# Patient Record
Sex: Male | Born: 1935 | Race: White | Hispanic: No | Smoking: Former smoker
Health system: Southern US, Community
[De-identification: ages and names within clinical notes are randomized; demographics above are authoritative.]

## PROBLEM LIST (undated history)

## (undated) DIAGNOSIS — M1990 Unspecified osteoarthritis, unspecified site: Secondary | ICD-10-CM

## (undated) DIAGNOSIS — M1711 Unilateral primary osteoarthritis, right knee: Secondary | ICD-10-CM

## (undated) DIAGNOSIS — I4891 Unspecified atrial fibrillation: Secondary | ICD-10-CM

## (undated) DIAGNOSIS — H3530 Unspecified macular degeneration: Secondary | ICD-10-CM

## (undated) DIAGNOSIS — I10 Essential (primary) hypertension: Secondary | ICD-10-CM

## (undated) HISTORY — DX: Unspecified macular degeneration: H35.30

## (undated) HISTORY — PX: JOINT REPLACEMENT: SHX530

## (undated) HISTORY — DX: Unspecified atrial fibrillation: I48.91

## (undated) HISTORY — DX: Essential (primary) hypertension: I10

## (undated) HISTORY — PX: HIP SURGERY: SHX245

---

## 1957-12-13 HISTORY — PX: NOSE SURGERY: SHX723

## 1983-05-14 HISTORY — PX: FOOT ARTHRODESIS, MODIFIED MCBRIDE: SUR52

## 1997-05-13 HISTORY — PX: INGUINAL HERNIA REPAIR: SUR1180

## 1998-04-12 HISTORY — PX: KNEE CARTILAGE SURGERY: SHX688

## 2002-07-13 HISTORY — PX: ROTATOR CUFF REPAIR: SHX139

## 2012-07-13 HISTORY — PX: INGUINAL HERNIA REPAIR: SUR1180

## 2015-11-12 ENCOUNTER — Ambulatory Visit (INDEPENDENT_AMBULATORY_CARE_PROVIDER_SITE_OTHER): Payer: Medicare Other | Admitting: Cardiology

## 2015-11-12 ENCOUNTER — Encounter: Payer: Self-pay | Admitting: Cardiology

## 2015-11-12 VITALS — BP 140/84 | HR 61 | Ht 71.0 in | Wt 209.0 lb

## 2015-11-12 DIAGNOSIS — H3530 Unspecified macular degeneration: Secondary | ICD-10-CM

## 2015-11-12 DIAGNOSIS — I480 Paroxysmal atrial fibrillation: Secondary | ICD-10-CM

## 2015-11-12 DIAGNOSIS — I10 Essential (primary) hypertension: Secondary | ICD-10-CM | POA: Insufficient documentation

## 2015-11-12 DIAGNOSIS — I4819 Other persistent atrial fibrillation: Secondary | ICD-10-CM | POA: Insufficient documentation

## 2015-11-12 DIAGNOSIS — I481 Persistent atrial fibrillation: Secondary | ICD-10-CM | POA: Diagnosis not present

## 2015-11-12 LAB — BASIC METABOLIC PANEL
BUN: 22 mg/dL (ref 7–25)
CALCIUM: 9.5 mg/dL (ref 8.6–10.3)
CO2: 25 mmol/L (ref 20–31)
CREATININE: 1.33 mg/dL — AB (ref 0.70–1.11)
Chloride: 103 mmol/L (ref 98–110)
GLUCOSE: 129 mg/dL — AB (ref 65–99)
Potassium: 4.1 mmol/L (ref 3.5–5.3)
Sodium: 139 mmol/L (ref 135–146)

## 2015-11-12 NOTE — Progress Notes (Signed)
Electrophysiology Office Note   Date:  11/12/2015   ID:  Craig EchevariaMichael O Mann, DOB 1933-10-31, MRN [id number]  PCP:  Pcp Not In System  Primary Electrophysiologist:  Craig Mann Craig LoaMartin Alann Avey, MD    Chief Complaint  Patient presents with  . Advice Only    consult  . Atrial Fibrillation     History of Present Illness: Craig Mann is a 81 y.o. male who presents today for electrophysiology evaluation.    He presents to clinic today for evaluation of atrial fibrillation. He says that he was diagnosed at the beginning of October in [place]. He lives in [place] but was vacationing there at the time. He says that he was feeling weak and tired and did not have his normal energy and went to the hospital. While in the hospital he had an EKG which showed atrial fibrillation. He had an echocardiogram as well which showed a mildly dilated left atrium and normal LV systolic function. He is here visiting his brother for 2 months planning to leave in the middle of January. The doctor [place] told him to see a cardiologist. He currently does not have symptoms of shortness of breath but does have very mild fatigue. He is tolerating his medications well. He was started on apixaban while in the hospital in [place] and has not missed any doses.   Today, he denies symptoms of palpitations, chest pain, shortness of breath, orthopnea, PND, lower extremity edema, claudication, dizziness, presyncope, syncope, bleeding, or neurologic sequela. The patient is tolerating medications without difficulties and is otherwise without complaint today.    Past Medical History  Diagnosis Date  . Macular degeneration of left eye   . A-fib (HCC)   . Hypertension    Past Surgical History  Procedure Laterality Date  . Hernia repair Left 07/2012    DOUBLE HERNIA  . Rotator cuff repair Right 07/2002    SHOULDER  . Total knee arthroplasty Left 10/1999 and 1969    TIMES [id number],1962  . Hip surgery Left 04/1999 AND 10/1994     REPLACEMENT X2  . Knee surgery Left 04/1998    CARTILAGE  . Hernia repair Right 05/1997  . Foot arthrodesis, modified mcbride Right 05/1983  . Nose surgery  1959     Current Outpatient Prescriptions  Medication Sig Dispense Refill  . apixaban (ELIQUIS) 2.5 MG TABS tablet Take 2.5 mg by mouth 2 (two) times daily.    . Ascorbic Acid (VITAMIN C PO) Take by mouth.    Marland Kitchen. b complex vitamins tablet Take 1 tablet by mouth daily.    Marland Kitchen. BETA CAROTENE PO Take by mouth.    . Cholecalciferol (VITAMIN D-3 PO) Take by mouth.    . colchicine 0.6 MG tablet Take 0.6 mg by mouth daily as needed.    . DiltiaZEM HCl Coated Beads (CARTIA XT PO) Take 180 mg by mouth daily.     Marland Kitchen. GARLIC-X PO Take by mouth.    Marland Kitchen. lisinopril (PRINIVIL,ZESTRIL) 20 MG tablet Take 20 mg by mouth daily.    Marland Kitchen. MAGNESIUM OXIDE PO Take by mouth.    . metoprolol (LOPRESSOR) 50 MG tablet Take 50 mg by mouth 2 (two) times daily.    . Misc Natural Products (GLUCOSAMINE CHONDROITIN MSM PO) Take by mouth.    . Multiple Vitamins-Minerals (ICAPS AREDS 2 PO) Take two tablets by mouth daily    . Multiple Vitamins-Minerals (OCUVITE PO) Take two tablets by mouth  daily    . Multiple  Vitamins-Minerals (ZINC PO) Take by mouth.    . Omega-3 Fatty Acids (OMEGA 3 PO) 360 mg. Take two tablets daily    . Saw Palmetto, Serenoa repens, (SAW PALMETTO PO) Take by mouth.     No current facility-administered medications for this visit.    Allergies:   Review of patient's allergies indicates not on file.   Social History:  The patient  reports that he has quit smoking. He does not have any smokeless tobacco history on file. He reports that he drinks alcohol. He reports that he does not use illicit drugs.   Family History:  The patient's family history includes Kidney failure in his brother.    ROS:  Please see the history of present illness.   Otherwise, review of systems is positive for ankle swelling, constipation, anxiety.   All other systems are  reviewed and negative.    PHYSICAL EXAM: VS:  BP 140/84 mmHg  Pulse 61  Ht  (1.803 m)  Wt 209 lb (94.802 kg)  BMI 29.16 kg/m2 , BMI Body mass index is 29.16 kg/(m^2). GEN: Well nourished, well developed, in no acute distress HEENT: normal Neck: no JVD, carotid bruits, or masses Cardiac: irregular rhythm; no murmurs, rubs, or gallops,no edema  Respiratory:  clear to auscultation bilaterally, normal work of breathing GI: soft, nontender, nondistended, + BS MS: no deformity or atrophy Skin: warm and dry Neuro:  Strength and sensation are intact Psych: euthymic mood, full affect  EKG:  EKG is ordered today. The ekg ordered today shows atrial fibrillation, rate 61  Recent Labs: No results found for requested labs within last 365 days.    Lipid Panel  No results found for: CHOL, TRIG, HDL, CHOLHDL, VLDL, LDLCALC, LDLDIRECT   Wt Readings from Last 3 Encounters:  11/12/15 209 lb (94.802 kg)      Other studies Reviewed: Additional studies/ records that were reviewed today include: TTE 09/28/15 Review of the above records today demonstrates:   the left ventricular size, wall thickness, and systolic function are normal. EF 55 to 60%. The left atrium is mildly dilated. There is mild mitral regurgitation.   ASSESSMENT AND PLAN:  1.  Persistent Atrial fibrillation: newly diagnosed as of October. It does not appear that he has been in atrial fibrillation for a long as his left atrium is not very dilated. I have discussed with him treatment options such as medications versus cardioversion. He does not like the idea of medications as a first-line therapy and would prefer to try cardioversion for sinus rhythm. He is on Eliquis 2.5 mg and has had a low  GFR. This was checked in [place] and I do not have evidence of it being rechecked. I Effie Janoski recheck today and adjust his Eliquis as indicated.  If his Eliquis needs to be adjusted we Dominick Morella wait 3 weeks until cardioversion.  This  patients CHA2DS2-VASc Score and unadjusted Ischemic Stroke Rate (% per year) is equal to 3.2 % stroke rate/year from a score of 3  Above score calculated as 1 point each if present [CHF, HTN, DM, Vascular=MI/PAD/Aortic Plaque, Age if 65-74, or Male] Above score calculated as 2 points each if present [Age > 75, or Stroke/TIA/TE]     Current medicines are reviewed at length with the patient today.   The patient does not have concerns regarding his medicines.  The following changes were made today:  none  Labs/ tests ordered today include:  No orders of the defined types were placed in  this encounter.     Disposition:   FU with Vaughn Frieze post cardioversion Signed, Zowie Lundahl Craig Loa, MD  11/12/2015 10:15 AM     [facility] [address] [place] [address] [phone] (office) [phone] (fax)

## 2015-11-12 NOTE — Patient Instructions (Addendum)
Medication Instructions:  Your physician recommends that you continue on your current medications as directed. Please refer to the Current Medication list given to you today.  Labwork: Today - BMET  Testing/Procedures: None ordered  Follow-Up: No follow up is needed at this time with Dr. Elberta Fortisamnitz.  He will see you on an as needed basis.   If you need a refill on your cardiac medications before your next appointment, please call your pharmacy.  Thank you for choosing [facility]!!   Dory HornSherri Zhana Jeangilles, RN [phone]

## 2015-11-17 ENCOUNTER — Telehealth: Payer: Self-pay | Admitting: *Deleted

## 2015-11-17 ENCOUNTER — Encounter (INDEPENDENT_AMBULATORY_CARE_PROVIDER_SITE_OTHER): Payer: Medicare Other | Admitting: Ophthalmology

## 2015-11-17 DIAGNOSIS — H353112 Nonexudative age-related macular degeneration, right eye, intermediate dry stage: Secondary | ICD-10-CM | POA: Diagnosis not present

## 2015-11-17 DIAGNOSIS — H35033 Hypertensive retinopathy, bilateral: Secondary | ICD-10-CM

## 2015-11-17 DIAGNOSIS — H2513 Age-related nuclear cataract, bilateral: Secondary | ICD-10-CM | POA: Diagnosis not present

## 2015-11-17 DIAGNOSIS — H43813 Vitreous degeneration, bilateral: Secondary | ICD-10-CM

## 2015-11-17 DIAGNOSIS — H353221 Exudative age-related macular degeneration, left eye, with active choroidal neovascularization: Secondary | ICD-10-CM

## 2015-11-17 DIAGNOSIS — I10 Essential (primary) hypertension: Secondary | ICD-10-CM

## 2015-11-17 MED ORDER — APIXABAN 5 MG PO TABS
5.0000 mg | ORAL_TABLET | Freq: Two times a day (BID) | ORAL | Status: DC
Start: 2015-11-17 — End: 2018-05-10

## 2015-11-17 NOTE — Telephone Encounter (Signed)
Last Creatinine 1.33.   Advised patient to increase Eliquis to 5 mg twice per day.   Samples left at front desk for him to pick up tomorrow.  He understands I will arrange a DCCV 3 weeks from now and call him by the end of the week to discuss instructions.  Creatinine 11/30 was 1.33.  It was previously 1.6 on 10/26 and his PCP advised him to drink more water. He is planning on leaving for [place] (for 4 months) mid January, before returning to [place].  I advised him, while down in [place], to follow up on BMP to follow kidney function and ensure he is on the correct dose of Eliquis. I will review this again when I call him to discuss DCCV instructions. He is agreeable to above stated plan.

## 2015-11-20 ENCOUNTER — Telehealth: Payer: Self-pay | Admitting: *Deleted

## 2015-11-20 NOTE — Telephone Encounter (Signed)
Called patient to review DCCV instructions, per our agreement on 12/5. Patient tells me that he would like to wait for DCCV until he goes to [place] next month.  He would like to see his primary doctor there first. Advised patient to call office back if he changes his mind or symptoms occur.  Also advised to continue Eliquis. Patient verbalized understanding and agreeable to plan.

## 2015-11-29 ENCOUNTER — Emergency Department (HOSPITAL_COMMUNITY)
Admission: EM | Admit: 2015-11-29 | Discharge: 2015-11-29 | Disposition: A | Discharge status: Home / Self Care | Payer: Medicare Other | Attending: Emergency Medicine | Admitting: Emergency Medicine

## 2015-11-29 ENCOUNTER — Emergency Department (HOSPITAL_COMMUNITY): Payer: Medicare Other

## 2015-11-29 ENCOUNTER — Encounter (HOSPITAL_COMMUNITY): Payer: Self-pay | Admitting: Emergency Medicine

## 2015-11-29 DIAGNOSIS — Z7902 Long term (current) use of antithrombotics/antiplatelets: Secondary | ICD-10-CM | POA: Insufficient documentation

## 2015-11-29 DIAGNOSIS — I4891 Unspecified atrial fibrillation: Secondary | ICD-10-CM | POA: Diagnosis not present

## 2015-11-29 DIAGNOSIS — Z87891 Personal history of nicotine dependence: Secondary | ICD-10-CM | POA: Insufficient documentation

## 2015-11-29 DIAGNOSIS — Z79899 Other long term (current) drug therapy: Secondary | ICD-10-CM | POA: Insufficient documentation

## 2015-11-29 DIAGNOSIS — M7989 Other specified soft tissue disorders: Secondary | ICD-10-CM

## 2015-11-29 DIAGNOSIS — I10 Essential (primary) hypertension: Secondary | ICD-10-CM | POA: Insufficient documentation

## 2015-11-29 DIAGNOSIS — R220 Localized swelling, mass and lump, head: Secondary | ICD-10-CM | POA: Insufficient documentation

## 2015-11-29 DIAGNOSIS — Z8669 Personal history of other diseases of the nervous system and sense organs: Secondary | ICD-10-CM | POA: Insufficient documentation

## 2015-11-29 LAB — COMPREHENSIVE METABOLIC PANEL
ALBUMIN: 3.6 g/dL (ref 3.5–5.0)
ALK PHOS: 64 U/L (ref 38–126)
ALT: 54 U/L (ref 17–63)
AST: 27 U/L (ref 15–41)
Anion gap: 7 (ref 5–15)
BILIRUBIN TOTAL: 1 mg/dL (ref 0.3–1.2)
BUN: 23 mg/dL — AB (ref 6–20)
CALCIUM: 9.7 mg/dL (ref 8.9–10.3)
CO2: 27 mmol/L (ref 22–32)
CREATININE: 1.44 mg/dL — AB (ref 0.61–1.24)
Chloride: 106 mmol/L (ref 101–111)
GFR calc Af Amer: 51 mL/min — ABNORMAL LOW (ref >60)
GFR, EST NON AFRICAN AMERICAN: 44 mL/min — AB (ref >60)
GLUCOSE: 144 mg/dL — AB (ref 65–99)
Potassium: 4.1 mmol/L (ref 3.5–5.1)
Sodium: 140 mmol/L (ref 135–145)
TOTAL PROTEIN: 6.4 g/dL — AB (ref 6.5–8.1)

## 2015-11-29 LAB — CBC
HEMATOCRIT: 47.1 % (ref 39.0–52.0)
Hemoglobin: 15.3 g/dL (ref 13.0–17.0)
MCH: 30.3 pg (ref 26.0–34.0)
MCHC: 32.5 g/dL (ref 30.0–36.0)
MCV: 93.3 fL (ref 78.0–100.0)
PLATELETS: 144 10*3/uL — AB (ref 150–400)
RBC: 5.05 MIL/uL (ref 4.22–5.81)
RDW: 13.9 % (ref 11.5–15.5)
WBC: 6.7 10*3/uL (ref 4.0–10.5)

## 2015-11-29 LAB — URINALYSIS, ROUTINE W REFLEX MICROSCOPIC
BILIRUBIN URINE: NEGATIVE
Glucose, UA: NEGATIVE mg/dL
KETONES UR: NEGATIVE mg/dL
Leukocytes, UA: NEGATIVE
NITRITE: NEGATIVE
PH: 6 (ref 5.0–8.0)
PROTEIN: 30 mg/dL — AB
Specific Gravity, Urine: 1.012 (ref 1.005–1.030)

## 2015-11-29 LAB — URINE MICROSCOPIC-ADD ON: Bacteria, UA: NONE SEEN

## 2015-11-29 NOTE — Discharge Instructions (Signed)
You were seen today for your leg swelling (edema) the exact cause is not entirely known but it does not appear to be from your heart or from a blood clot.  If you develop chest pain or shortness of breath present for emergent reevaluation.  Try to exercise daily (light exercise like walking for 20 minutes), keeping your legs elevated when sitting or laying, and using compression stockings to see if this will help.  Try not to eat too much salt.  If the legs become red or hot or one starts swelling significantly more than the other return immediately for evaluation.  Edema Edema is an abnormal buildup of fluids in your bodytissues. Edema is somewhatdependent on gravity to pull the fluid to the lowest place in your body. That makes the condition more common in the legs and thighs (lower extremities). Painless swelling of the feet and ankles is common and becomes more likely as you get older. It is also common in looser tissues, like around your eyes.  When the affected area is squeezed, the fluid may move out of that spot and leave a dent for a few moments. This dent is called pitting.  CAUSES  There are many possible causes of edema. Eating too much salt and being on your feet or sitting for a long time can cause edema in your legs and ankles. Hot weather may make edema worse. Common medical causes of edema include:  Heart failure.  Liver disease.  Kidney disease.  Weak blood vessels in your legs.  Cancer.  An injury.  Pregnancy.  Some medications.  Obesity. SYMPTOMS  Edema is usually painless.Your skin may look swollen or shiny.  DIAGNOSIS  Your health care provider may be able to diagnose edema by asking about your medical history and doing a physical exam. You may need to have tests such as X-rays, an electrocardiogram, or blood tests to check for medical conditions that may cause edema.  TREATMENT  Edema treatment depends on the cause. If you have heart, liver, or kidney disease,  you need the treatment appropriate for these conditions. General treatment may include:  Elevation of the affected body part above the level of your heart.  Compression of the affected body part. Pressure from elastic bandages or support stockings squeezes the tissues and forces fluid back into the blood vessels. This keeps fluid from entering the tissues.  Restriction of salt intake.  Use of a water pill (diuretic). These medications are appropriate only for some types of edema. They pull fluid out of your body and make you urinate more often. This gets rid of fluid and reduces swelling, but diuretics can have side effects. Only use diuretics as directed by your health care provider. - This is something you should discuss with your primary care physician as your kidney function would need to be monitored closely. HOME CARE INSTRUCTIONS   Keep the affected body part above the level of your heart when you are lying down.   Do not sit still or stand for prolonged periods.   Do not put anything directly under your knees when lying down.  Do not wear constricting clothing or garters on your upper legs.   Exercise your legs to work the fluid back into your blood vessels. This may help the swelling go down.   Wear elastic bandages or support stockings to reduce ankle swelling as directed by your health care provider.   Eat a low-salt diet to reduce fluid if your health care provider  recommends it.   Only take medicines as directed by your health care provider. SEEK MEDICAL CARE IF:   Your edema is not responding to treatment.  You have heart, liver, or kidney disease and notice symptoms of edema.  You have edema in your legs that does not improve after elevating them.   You have sudden and unexplained weight gain. SEEK IMMEDIATE MEDICAL CARE IF:   You develop shortness of breath or chest pain.   You cannot breathe when you lie down.  You develop pain, redness, or warmth in  the swollen areas.   You have heart, liver, or kidney disease and suddenly get edema.  You have a fever and your symptoms suddenly get worse. MAKE SURE YOU:   Understand these instructions.  Will watch your condition.  Will get help right away if you are not doing well or get worse.   This information is not intended to replace advice given to you by your health care provider. Make sure you discuss any questions you have with your health care provider.   Document Released: 11/29/2005 Document Revised: 12/20/2014 Document Reviewed: 09/21/2013 Elsevier Interactive Patient Education 2016 [employer].  Diabetes Mellitus and Food It is important for you to manage your blood sugar (glucose) level. Your blood glucose level can be greatly affected by what you eat. Eating healthier foods in the appropriate amounts throughout the day at about the same time each day will help you control your blood glucose level. It can also help slow or prevent worsening of your diabetes mellitus. Healthy eating may even help you improve the level of your blood pressure and reach or maintain a healthy weight.  General recommendations for healthful eating and cooking habits include:  Eating meals and snacks regularly. Avoid going long periods of time without eating to lose weight.  Eating a diet that consists mainly of plant-based foods, such as fruits, vegetables, nuts, legumes, and whole grains.  Using low-heat cooking methods, such as baking, instead of high-heat cooking methods, such as deep frying. Work with your dietitian to make sure you understand how to use the Nutrition Facts information on food labels. HOW CAN FOOD AFFECT ME? Carbohydrates Carbohydrates affect your blood glucose level more than any other type of food. Your dietitian will help you determine how many carbohydrates to eat at each meal and teach you how to count carbohydrates. Counting carbohydrates is important to keep your blood  glucose at a healthy level, especially if you are using insulin or taking certain medicines for diabetes mellitus. Alcohol Alcohol can cause sudden decreases in blood glucose (hypoglycemia), especially if you use insulin or take certain medicines for diabetes mellitus. Hypoglycemia can be a life-threatening condition. Symptoms of hypoglycemia (sleepiness, dizziness, and disorientation) are similar to symptoms of having too much alcohol.  If your health care provider has given you approval to drink alcohol, do so in moderation and use the following guidelines:  Women should not have more than one drink per day, and men should not have more than two drinks per day. One drink is equal to:  12 oz of beer.  5 oz of wine.  1 oz of hard liquor.  Do not drink on an empty stomach.  Keep yourself hydrated. Have water, diet soda, or unsweetened iced tea.  Regular soda, juice, and other mixers might contain a lot of carbohydrates and should be counted. WHAT FOODS ARE NOT RECOMMENDED? As you make food choices, it is important to remember that all foods are  not the same. Some foods have fewer nutrients per serving than other foods, even though they might have the same number of calories or carbohydrates. It is difficult to get your body what it needs when you eat foods with fewer nutrients. Examples of foods that you should avoid that are high in calories and carbohydrates but low in nutrients include:  Trans fats (most processed foods list trans fats on the Nutrition Facts label).  Regular soda.  Juice.  Candy.  Sweets, such as cake, pie, doughnuts, and cookies.  Fried foods. WHAT FOODS CAN I EAT? Eat nutrient-rich foods, which will nourish your body and keep you healthy. The food you should eat also will depend on several factors, including:  The calories you need.  The medicines you take.  Your weight.  Your blood glucose level.  Your blood pressure level.  Your cholesterol  level. You should eat a variety of foods, including:  Protein.  Lean cuts of meat.  Proteins low in saturated fats, such as fish, egg whites, and beans. Avoid processed meats.  Fruits and vegetables.  Fruits and vegetables that may help control blood glucose levels, such as apples, mangoes, and yams.  Dairy products.  Choose fat-free or low-fat dairy products, such as milk, yogurt, and cheese.  Grains, bread, pasta, and rice.  Choose whole grain products, such as multigrain bread, whole oats, and brown rice. These foods may help control blood pressure.  Fats.  Foods containing healthful fats, such as nuts, avocado, olive oil, canola oil, and fish. DOES EVERYONE WITH DIABETES MELLITUS HAVE THE SAME MEAL PLAN? Because every person with diabetes mellitus is different, there is not one meal plan that works for everyone. It is very important that you meet with a dietitian who will help you create a meal plan that is just right for you.   This information is not intended to replace advice given to you by your health care provider. Make sure you discuss any questions you have with your health care provider.   Document Released: 08/26/2005 Document Revised: 12/20/2014 Document Reviewed: 10/26/2013 Elsevier Interactive Patient Education [employer]

## 2015-11-29 NOTE — ED Notes (Signed)
Pt sts bilateral leg swelling x several weeks getting more severe; pt denies SOB or other complaint

## 2015-11-29 NOTE — ED Notes (Signed)
Pt comfortable with discharge and follow up instructions. Pt declines wheelchair, escorted to waiting area by this RN. No prescriptions. 

## 2015-11-29 NOTE — ED Provider Notes (Signed)
CSN: [id number]     Arrival date & time 11/29/15  1241 History   First MD Initiated Contact with Patient 11/29/15 1506     Chief Complaint  Patient presents with  . Leg Swelling     (Consider location/radiation/quality/duration/timing/severity/associated sxs/prior Treatment) HPI Comments: 81 y.o. Male with history of atrial fibrillation on Eliquis, HTN, CKD presents for leg swelling.  The patient reports that he was diagnosed with atrial fibrillation in October and has not exercised or been active since then because he was scared this would make the atrial fibrillation worse.  He says that over the last 4 weeks or more he has had progressive swelling of his bilateral legs that he says does get worse when he is sitting for long periods of time.  Denies redness or warmth of the legs.  Takes his Eliquis daily.  Denies chest pain, shortness of breath, palpitations, orthopnea.   Past Medical History  Diagnosis Date  . Macular degeneration of left eye   . A-fib (HCC)   . Hypertension    Past Surgical History  Procedure Laterality Date  . Hernia repair Left 07/2012    DOUBLE HERNIA  . Rotator cuff repair Right 07/2002    SHOULDER  . Total knee arthroplasty Left 10/1999 and 1969    TIMES [id number]  . Hip surgery Left 04/1999 AND 10/1994    REPLACEMENT X2  . Knee surgery Left 04/1998    CARTILAGE  . Hernia repair Right 05/1997  . Foot arthrodesis, modified mcbride Right 05/1983  . Nose surgery  1959   Family History  Problem Relation Age of Onset  . Kidney failure Brother    Social History  Substance Use Topics  . Smoking status: Former Games developer  . Smokeless tobacco: None  . Alcohol Use: 0.0 oz/week    0 Standard drinks or equivalent per week    Review of Systems  Constitutional: Negative for chills, appetite change and fatigue.  HENT: Negative for congestion and rhinorrhea.   Respiratory: Negative for cough, chest tightness and shortness of breath.   Cardiovascular: Positive for  leg swelling (bilateral, symmetric). Negative for chest pain and palpitations.  Gastrointestinal: Negative for nausea, vomiting, abdominal pain and diarrhea.  Genitourinary: Negative for dysuria, urgency and hematuria.  Musculoskeletal: Negative for myalgias and back pain.  Skin: Negative for rash.  Neurological: Negative for dizziness, weakness, light-headedness, numbness and headaches.  Hematological: Does not bruise/bleed easily.      Allergies  Review of patient's allergies indicates no known allergies.  Home Medications   Prior to Admission medications   Medication Sig Start Date End Date Taking? Authorizing Provider  apixaban (ELIQUIS) 5 MG TABS tablet Take 1 tablet (5 mg total) by mouth 2 (two) times daily. 11/17/15  Yes Will [name], MD  Ascorbic Acid (VITAMIN C PO) Take 1 tablet by mouth daily.    Yes Historical Provider, MD  b complex vitamins tablet Take 1 tablet by mouth daily.   Yes Historical Provider, MD  BETA CAROTENE PO Take 1 tablet by mouth daily.    Yes Historical Provider, MD  Cholecalciferol (VITAMIN D-3 PO) Take 1 tablet by mouth daily.    Yes Historical Provider, MD  diltiazem (DILACOR XR) 180 MG 24 hr capsule Take 180 mg by mouth daily.   Yes Historical Provider, MD  GARLIC-X PO Take by mouth.   Yes Historical Provider, MD  lisinopril (PRINIVIL,ZESTRIL) 20 MG tablet Take 20 mg by mouth daily.   Yes Historical Provider, MD  MAGNESIUM  OXIDE PO Take 1 tablet by mouth daily.    Yes Historical Provider, MD  metoprolol succinate (TOPROL-XL) 50 MG 24 hr tablet Take 50 mg by mouth daily. Take with or immediately following a meal.   Yes Historical Provider, MD  Misc Natural Products (GLUCOSAMINE CHONDROITIN MSM PO) Take by mouth.   Yes Historical Provider, MD  Multiple Vitamins-Minerals (ICAPS AREDS 2 PO) Take two tablets by mouth daily   Yes Historical Provider, MD  Multiple Vitamins-Minerals (OCUVITE PO) Take two tablets by mouth  daily   Yes Historical Provider,  MD  Multiple Vitamins-Minerals (ZINC PO) Take 1 tablet by mouth daily.    Yes Historical Provider, MD  Omega-3 Fatty Acids (OMEGA 3 PO) 360 mg. Take two tablets daily   Yes Historical Provider, MD  OVER THE COUNTER MEDICATION Take 1 tablet by mouth 2 (two) times daily. OTC.Prostate Vitamin   Yes Historical Provider, MD  Saw Palmetto, Serenoa repens, (SAW PALMETTO PO) Take by mouth.   Yes Historical Provider, MD  colchicine 0.6 MG tablet Take 0.6 mg by mouth daily as needed. For gout    Historical Provider, MD  DiltiaZEM HCl Coated Beads (CARTIA XT PO) Take 180 mg by mouth daily.     Historical Provider, MD   BP 138/98 mmHg  Pulse 73  Temp(Src) 97.9 F (36.6 C) (Oral)  Resp 14  Ht  (1.803 m)  Wt 208 lb 3.2 oz (94.439 kg)  BMI 29.05 kg/m2  SpO2 99% Physical Exam  Constitutional: He is oriented to person, place, and time. He appears well-developed and well-nourished. No distress.  HENT:  Head: Normocephalic and atraumatic.  Right Ear: External ear normal.  Left Ear: External ear normal.  Mouth/Throat: Oropharynx is clear and moist. No oropharyngeal exudate.  Eyes: EOM are normal. Pupils are equal, round, and reactive to light.  Neck: Normal range of motion. Neck supple.  Cardiovascular: Normal rate, normal heart sounds and intact distal pulses.  An irregularly irregular rhythm present.  No murmur heard. Pulmonary/Chest: Effort normal. No respiratory distress. He has no wheezes. He has no rales.  Abdominal: Soft. He exhibits no distension. There is no tenderness.  Musculoskeletal: He exhibits edema (2+ bilateral, symmetric edema without erythema or warmth).  Neurological: He is alert and oriented to person, place, and time.  Skin: Skin is warm and dry. No rash noted. He is not diaphoretic.  Vitals reviewed.   ED Course  Procedures (including critical care time) Labs Review Labs Reviewed  COMPREHENSIVE METABOLIC PANEL - Abnormal; Notable for the following:    Glucose, Bld  144 (*)    BUN 23 (*)    Creatinine, Ser 1.44 (*)    Total Protein 6.4 (*)    GFR calc non Af Amer 44 (*)    GFR calc Af Amer 51 (*)    All other components within normal limits  CBC - Abnormal; Notable for the following:    Platelets 144 (*)    All other components within normal limits  URINALYSIS, ROUTINE W REFLEX MICROSCOPIC (NOT AT [facility])    Imaging Review Dg Chest 2 View  11/29/2015  CLINICAL DATA:  Bilateral lower extremity swelling for 3 weeks with atrial fibrillation EXAM: CHEST - 2 VIEW COMPARISON:  None. FINDINGS: Cardiac shadow is within normal limits. Mild interstitial changes are noted without focal infiltrate or sizable effusion. Degenerative change of the thoracic spine is seen. IMPRESSION: Mild interstitial changes without acute abnormality. Electronically Signed   By: Eulah Pont.D.  On: 11/29/2015 14:08   I have personally reviewed and evaluated these images and lab results as part of my medical decision-making.   EKG Interpretation None      MDM  Patient was seen and evaluated in stable condition.  Patient on eliquis and compliant.  No sign of DVT/PE.  Patient without shortness of breath or orthopnea.  Patient well appearing.  Discussed option of venous duplex but at this time patient declined which clinically appropriate.  Patient with PCP he says he can follow up with.  Patient instructed to return immediately with signs of infection,shortness of breath, orthopnea.  Discussed measures for elevation of legs, compression, and light exercise which he expressed understanding of.  Patient was discharged home in stable condition. Final diagnoses:  Leg swelling    1. Peripheral edema.    Leta BaptistEmily Roe Nguyen, MD 11/29/15 1556

## 2015-12-11 ENCOUNTER — Ambulatory Visit (HOSPITAL_COMMUNITY): Admission: RE | Admit: 2015-12-11 | Payer: Medicare Other | Source: Ambulatory Visit | Admitting: Cardiovascular Disease

## 2015-12-11 ENCOUNTER — Encounter (HOSPITAL_COMMUNITY): Admission: RE | Payer: Self-pay | Source: Ambulatory Visit

## 2015-12-11 SURGERY — CARDIOVERSION
Anesthesia: Monitor Anesthesia Care

## 2015-12-23 ENCOUNTER — Encounter (INDEPENDENT_AMBULATORY_CARE_PROVIDER_SITE_OTHER): Payer: Medicare Other | Admitting: Ophthalmology

## 2016-03-13 HISTORY — PX: ATRIAL FIBRILLATION ABLATION: EP1191

## 2018-05-10 ENCOUNTER — Ambulatory Visit (INDEPENDENT_AMBULATORY_CARE_PROVIDER_SITE_OTHER): Payer: Medicare Other

## 2018-05-10 ENCOUNTER — Ambulatory Visit (INDEPENDENT_AMBULATORY_CARE_PROVIDER_SITE_OTHER): Payer: Medicare Other | Admitting: Orthopaedic Surgery

## 2018-05-10 DIAGNOSIS — M1711 Unilateral primary osteoarthritis, right knee: Secondary | ICD-10-CM | POA: Diagnosis not present

## 2018-05-10 NOTE — Progress Notes (Signed)
Office Visit Note   Patient: Craig Mann           Date of Birth: 11/14/1933           MRN: [id number] Visit Date: 05/10/2018              Requested by: No referring provider defined for this encounter. PCP: System, Pcp Not In   Assessment & Plan: Visit Diagnoses:  1. Primary osteoarthritis of right knee     Plan: Impression is end-stage tricompartmental degenerative joint disease right knee.  At this point patient wishes to pursue a right total knee replacement.  He is aware of the risks and benefits.  He had no postoperative complications in the past.  He is aware of the expected postoperative recovery.  He is supposed to see his PCP Dr. Waynard Edwards in about a week.  He will obtain preoperative clearance from him at that time.  We will schedule his surgery for around July 1 per patient request.  Follow-Up Instructions: Return if symptoms worsen or fail to improve.   Orders:  Orders Placed This Encounter  Procedures  . XR KNEE 3 VIEW RIGHT   No orders of the defined types were placed in this encounter.     Procedures: No procedures performed   Clinical Data: No additional findings.   Subjective: Chief Complaint  Patient presents with  . Right Knee - Pain    Patient is a very pleasant 84 year old gentleman who comes in with chronic right knee pain.  He has had bilateral hip and left knee replacements in the past and he is having chronic pain in his right knee that significantly affects his ADLs and quality of life.  He recently had a cortisone injection on 04/21/2018 in [place] which has given him some relief but he understands that this is temporary and wants a more permanent solution.  He takes 3 antihypertensives.  He no longer takes any blood thinners since his ablation for his atrial fibrillation.   Review of Systems  Constitutional: Negative.   All other systems reviewed and are negative.    Objective: Vital Signs: There were no vitals taken for this  visit.  Physical Exam  Constitutional: He is oriented to person, place, and time. He appears well-developed and well-nourished.  HENT:  Head: Normocephalic and atraumatic.  Eyes: Pupils are equal, round, and reactive to light.  Neck: Neck supple.  Pulmonary/Chest: Effort normal.  Abdominal: Soft.  Musculoskeletal: Normal range of motion.  Neurological: He is alert and oriented to person, place, and time.  Skin: Skin is warm.  Psychiatric: He has a normal mood and affect. His behavior is normal. Judgment and thought content normal.  Nursing note and vitals reviewed.   Ortho Exam Right knee exam shows significant crepitus.  He has good range of motion.  Collaterals and cruciates are stable. Specialty Comments:  No specialty comments available.  Imaging: Xr Knee 3 View Right  Result Date: 05/10/2018 Advanced tricompartmental degenerative joint disease of the right knee    PMFS History: Patient Active Problem List   Diagnosis Date Noted  . Persistent atrial fibrillation (HCC) 11/12/2015  . Essential hypertension 11/12/2015   Past Medical History:  Diagnosis Date  . A-fib (HCC)   . Hypertension   . Macular degeneration of left eye     Family History  Problem Relation Age of Onset  . Kidney failure Brother     Past Surgical History:  Procedure Laterality Date  . FOOT ARTHRODESIS,  MODIFIED MCBRIDE Right 05/1983  . HERNIA REPAIR Left 07/2012   DOUBLE HERNIA  . HERNIA REPAIR Right 05/1997  . HIP SURGERY Left 04/1999 AND 10/1994   REPLACEMENT X2  . KNEE SURGERY Left 04/1998   CARTILAGE  . NOSE SURGERY  1959  . ROTATOR CUFF REPAIR Right 07/2002   SHOULDER  . TOTAL KNEE ARTHROPLASTY Left 10/1999 and 1969   TIMES 12/4780,9562   Social History   Occupational History  . Not on file  Tobacco Use  . Smoking status: Former Smoker  Substance and Sexual Activity  . Alcohol use: Yes    Alcohol/week: 0.0 oz  . Drug use: No  . Sexual activity: Not on file

## 2018-05-22 ENCOUNTER — Other Ambulatory Visit (INDEPENDENT_AMBULATORY_CARE_PROVIDER_SITE_OTHER): Payer: Self-pay

## 2018-05-31 NOTE — Pre-Procedure Instructions (Signed)
Craig Mann  05/31/2018      [facility] [place] [address] [place] [address] [place] [address] Phone: [phone] Fax: [phone]    Your procedure is scheduled on Mon., June 12, 2018 from 10:00AM-12:00PM  [facility] "A" at 8:00AM  Call this number if you have problems the morning of surgery:  [phone]   Remember:  Do not eat or drink after midnight on June 30th    Take these medicines the morning of surgery with A SIP OF WATER: Metoprolol tartrate (LOPRESSOR) and Polyethyl Glycol-Propyl Glycol (SYSTANE). If needed Acetaminophen (TYLENOL)  7 days before surgery (6/24), stop taking all Other Aspirin Products, Vitamins, Fish oils, and Herbal medications. Also stop all NSAIDS i.e. Advil, Ibuprofen, Motrin, Aleve, Anaprox, Naproxen, BC, Goody Powders, and all Supplements.    Do not wear jewelry.  Do not wear lotions, powders, colognes, or deodorant.  Do not shave 48 hours prior to surgery.  Men may shave face.  Do not bring valuables to the hospital.  [facility] is not responsible for any belongings or valuables.  Contacts, dentures or bridgework may not be worn into surgery.  Leave your suitcase in the car.  After surgery it may be brought to your room.  For patients admitted to the hospital, discharge time will be determined by your treatment team.  Patients discharged the day of surgery will not be allowed to drive home.   Special instructions: [place]- Preparing For Surgery  Before surgery, you can play an important role. Because skin is not sterile, your skin needs to be as free of germs as possible. You can reduce the number of germs on your skin by washing with CHG (chlorahexidine gluconate) Soap before surgery.  CHG is an antiseptic cleaner which kills germs and bonds with the skin to continue killing germs even after washing.    Oral Hygiene is also important to  reduce your risk of infection.  Remember - BRUSH YOUR TEETH THE MORNING OF SURGERY WITH YOUR REGULAR TOOTHPASTE  Please do not use if you have an allergy to CHG or antibacterial soaps. If your skin becomes reddened/irritated stop using the CHG.  Do not shave (including legs and underarms) for at least 48 hours prior to first CHG shower. It is OK to shave your face.  Please follow these instructions carefully.   1. Shower the NIGHT BEFORE SURGERY and the MORNING OF SURGERY with CHG.   2. If you chose to wash your hair, wash your hair first as usual with your normal shampoo.  3. After you shampoo, rinse your hair and body thoroughly to remove the shampoo.  4. Use CHG as you would any other liquid soap. You can apply CHG directly to the skin and wash gently with a scrungie or a clean washcloth.   5. Apply the CHG Soap to your body ONLY FROM THE NECK DOWN.  Do not use on open wounds or open sores. Avoid contact with your eyes, ears, mouth and genitals (private parts). Wash Face and genitals (private parts)  with your normal soap.  6. Wash thoroughly, paying special attention to the area where your surgery will be performed.  7. Thoroughly rinse your body with warm water from the neck down.  8. DO NOT shower/wash with your normal soap after using and rinsing off the CHG Soap.  9. Pat yourself dry with a CLEAN TOWEL.  10. Wear CLEAN PAJAMAS to  bed the night before surgery, wear comfortable clothes the morning of surgery  11. Place CLEAN SHEETS on your bed the night of your first shower and DO NOT SLEEP WITH PETS.  Day of Surgery:  Do not apply any deodorants/lotions.  Please wear clean clothes to the hospital/surgery center.   Remember to brush your teeth WITH YOUR REGULAR TOOTHPASTE.  Please read over the following fact sheets that you were given. Pain Booklet, Coughing and Deep Breathing, MRSA Information and Surgical Site Infection Prevention

## 2018-06-01 ENCOUNTER — Other Ambulatory Visit: Payer: Self-pay

## 2018-06-01 ENCOUNTER — Ambulatory Visit (HOSPITAL_COMMUNITY)
Admission: RE | Admit: 2018-06-01 | Discharge: 2018-06-01 | Disposition: A | Discharge status: Home / Self Care | Payer: Medicare Other | Source: Ambulatory Visit | Attending: Physician Assistant | Admitting: Physician Assistant

## 2018-06-01 ENCOUNTER — Encounter (HOSPITAL_COMMUNITY)
Admission: RE | Admit: 2018-06-01 | Discharge: 2018-06-01 | Disposition: A | Discharge status: Home / Self Care | Payer: Medicare Other | Source: Ambulatory Visit | Attending: Orthopaedic Surgery | Admitting: Orthopaedic Surgery

## 2018-06-01 ENCOUNTER — Encounter (HOSPITAL_COMMUNITY): Payer: Self-pay

## 2018-06-01 DIAGNOSIS — Z01812 Encounter for preprocedural laboratory examination: Secondary | ICD-10-CM | POA: Diagnosis not present

## 2018-06-01 DIAGNOSIS — I10 Essential (primary) hypertension: Secondary | ICD-10-CM | POA: Insufficient documentation

## 2018-06-01 DIAGNOSIS — M1711 Unilateral primary osteoarthritis, right knee: Secondary | ICD-10-CM

## 2018-06-01 DIAGNOSIS — I4891 Unspecified atrial fibrillation: Secondary | ICD-10-CM | POA: Diagnosis not present

## 2018-06-01 DIAGNOSIS — Z01818 Encounter for other preprocedural examination: Secondary | ICD-10-CM | POA: Insufficient documentation

## 2018-06-01 DIAGNOSIS — Z87891 Personal history of nicotine dependence: Secondary | ICD-10-CM | POA: Diagnosis not present

## 2018-06-01 DIAGNOSIS — R9431 Abnormal electrocardiogram [ECG] [EKG]: Secondary | ICD-10-CM | POA: Insufficient documentation

## 2018-06-01 DIAGNOSIS — Z01810 Encounter for preprocedural cardiovascular examination: Secondary | ICD-10-CM | POA: Insufficient documentation

## 2018-06-01 HISTORY — DX: Unilateral primary osteoarthritis, right knee: M17.11

## 2018-06-01 HISTORY — DX: Unspecified osteoarthritis, unspecified site: M19.90

## 2018-06-01 LAB — CBC WITH DIFFERENTIAL/PLATELET
ABS IMMATURE GRANULOCYTES: 0.1 10*3/uL (ref 0.0–0.1)
BASOS ABS: 0 10*3/uL (ref 0.0–0.1)
Basophils Relative: 0 %
Eosinophils Absolute: 0.1 10*3/uL (ref 0.0–0.7)
Eosinophils Relative: 2 %
HCT: 46.7 % (ref 39.0–52.0)
HEMOGLOBIN: 15.3 g/dL (ref 13.0–17.0)
Immature Granulocytes: 1 %
LYMPHS ABS: 1 10*3/uL (ref 0.7–4.0)
Lymphocytes Relative: 14 %
MCH: 30.5 pg (ref 26.0–34.0)
MCHC: 32.8 g/dL (ref 30.0–36.0)
MCV: 93 fL (ref 78.0–100.0)
Monocytes Absolute: 0.7 10*3/uL (ref 0.1–1.0)
Monocytes Relative: 10 %
NEUTROS ABS: 5 10*3/uL (ref 1.7–7.7)
Neutrophils Relative %: 73 %
Platelets: 147 10*3/uL — ABNORMAL LOW (ref 150–400)
RBC: 5.02 MIL/uL (ref 4.22–5.81)
RDW: 13.6 % (ref 11.5–15.5)
WBC: 6.9 10*3/uL (ref 4.0–10.5)

## 2018-06-01 LAB — COMPREHENSIVE METABOLIC PANEL
ALBUMIN: 4 g/dL (ref 3.5–5.0)
ALT: 15 U/L — AB (ref 17–63)
AST: 17 U/L (ref 15–41)
Alkaline Phosphatase: 65 U/L (ref 38–126)
Anion gap: 10 (ref 5–15)
BUN: 24 mg/dL — AB (ref 6–20)
CALCIUM: 9.9 mg/dL (ref 8.9–10.3)
CO2: 28 mmol/L (ref 22–32)
CREATININE: 1.76 mg/dL — AB (ref 0.61–1.24)
Chloride: 106 mmol/L (ref 101–111)
GFR calc Af Amer: 39 mL/min — ABNORMAL LOW (ref >60)
GFR calc non Af Amer: 34 mL/min — ABNORMAL LOW (ref >60)
GLUCOSE: 164 mg/dL — AB (ref 65–99)
Potassium: 3.9 mmol/L (ref 3.5–5.1)
SODIUM: 144 mmol/L (ref 135–145)
Total Bilirubin: 1 mg/dL (ref 0.3–1.2)
Total Protein: 6.7 g/dL (ref 6.5–8.1)

## 2018-06-01 LAB — SURGICAL PCR SCREEN
MRSA, PCR: NEGATIVE
Staphylococcus aureus: NEGATIVE

## 2018-06-01 LAB — TYPE AND SCREEN
ABO/RH(D): A POS
Antibody Screen: NEGATIVE

## 2018-06-01 LAB — PROTIME-INR
INR: 1.1
Prothrombin Time: 14.1 seconds (ref 11.4–15.2)

## 2018-06-01 LAB — ABO/RH: ABO/RH(D): A POS

## 2018-06-01 LAB — APTT: APTT: 30 s (ref 24–36)

## 2018-06-01 NOTE — Progress Notes (Signed)
PCP - Dr. Waynard EdwardsPerini & Dr. Nickie RetortMaria Mann [facility]([place])  Cardiologist - Dr. Sherryll BurgerShah [facility]([place])  Chest x-ray - 06/01/18  EKG - 06/01/18  Stress Test - Denies  ECHO - 01/17/16 (CE)  Cardiac Cath - Denies  Sleep Study - Denies CPAP - None  LABS- 06/01/18: CBC w/D, CMP, PT, PTT, T/S  ASA- Denies   Anesthesia- No  Pt denies having chest pain, sob, or fever at this time. All instructions explained to the pt, with a verbal understanding of the material. Pt agrees to go over the instructions while at home for a better understanding. The opportunity to ask questions was provided.

## 2018-06-01 NOTE — Progress Notes (Signed)
Do we have cardiac clearance?

## 2018-06-09 MED ORDER — TRANEXAMIC ACID 1000 MG/10ML IV SOLN
2000.0000 mg | INTRAVENOUS | Status: AC
  Administered 2018-06-12: 2000 mg via TOPICAL
  Filled 2018-06-09: qty 20

## 2018-06-09 MED ORDER — TRANEXAMIC ACID 1000 MG/10ML IV SOLN
1000.0000 mg | INTRAVENOUS | Status: AC
  Administered 2018-06-12: 1000 mg via INTRAVENOUS
  Filled 2018-06-09: qty 1100

## 2018-06-12 ENCOUNTER — Ambulatory Visit (HOSPITAL_COMMUNITY): Payer: Medicare Other | Admitting: Anesthesiology

## 2018-06-12 ENCOUNTER — Ambulatory Visit (HOSPITAL_COMMUNITY): Payer: Medicare Other

## 2018-06-12 ENCOUNTER — Inpatient Hospital Stay (HOSPITAL_COMMUNITY)
Admission: RE | Admit: 2018-06-12 | Discharge: 2018-06-14 | DRG: 470 | Disposition: A | Discharge status: Home / Self Care | Payer: Medicare Other | Source: Ambulatory Visit | Attending: Orthopaedic Surgery | Admitting: Orthopaedic Surgery

## 2018-06-12 ENCOUNTER — Encounter (HOSPITAL_COMMUNITY)
Admission: RE | Disposition: A | Discharge status: Home / Self Care | Payer: Self-pay | Source: Ambulatory Visit | Attending: Orthopaedic Surgery

## 2018-06-12 ENCOUNTER — Encounter (HOSPITAL_COMMUNITY): Payer: Self-pay | Admitting: Anesthesiology

## 2018-06-12 ENCOUNTER — Telehealth (INDEPENDENT_AMBULATORY_CARE_PROVIDER_SITE_OTHER): Payer: Self-pay | Admitting: Orthopaedic Surgery

## 2018-06-12 DIAGNOSIS — I4891 Unspecified atrial fibrillation: Secondary | ICD-10-CM | POA: Diagnosis present

## 2018-06-12 DIAGNOSIS — M1711 Unilateral primary osteoarthritis, right knee: Principal | ICD-10-CM | POA: Diagnosis present

## 2018-06-12 DIAGNOSIS — Z87891 Personal history of nicotine dependence: Secondary | ICD-10-CM | POA: Diagnosis not present

## 2018-06-12 DIAGNOSIS — Z79899 Other long term (current) drug therapy: Secondary | ICD-10-CM

## 2018-06-12 DIAGNOSIS — Z96651 Presence of right artificial knee joint: Secondary | ICD-10-CM

## 2018-06-12 DIAGNOSIS — Z96652 Presence of left artificial knee joint: Secondary | ICD-10-CM | POA: Diagnosis present

## 2018-06-12 DIAGNOSIS — H3530 Unspecified macular degeneration: Secondary | ICD-10-CM | POA: Diagnosis present

## 2018-06-12 DIAGNOSIS — I10 Essential (primary) hypertension: Secondary | ICD-10-CM | POA: Diagnosis present

## 2018-06-12 DIAGNOSIS — Z6831 Body mass index (BMI) 31.0-31.9, adult: Secondary | ICD-10-CM

## 2018-06-12 DIAGNOSIS — E669 Obesity, unspecified: Secondary | ICD-10-CM | POA: Diagnosis present

## 2018-06-12 DIAGNOSIS — Z96659 Presence of unspecified artificial knee joint: Secondary | ICD-10-CM

## 2018-06-12 HISTORY — PX: TOTAL KNEE ARTHROPLASTY: SHX125

## 2018-06-12 SURGERY — ARTHROPLASTY, KNEE, TOTAL
Anesthesia: Monitor Anesthesia Care | Site: Knee | Laterality: Right

## 2018-06-12 MED ORDER — KETOROLAC TROMETHAMINE 15 MG/ML IJ SOLN
30.0000 mg | Freq: Four times a day (QID) | INTRAMUSCULAR | Status: DC
  Administered 2018-06-12: 30 mg via INTRAVENOUS
  Filled 2018-06-12: qty 2

## 2018-06-12 MED ORDER — CHLORHEXIDINE GLUCONATE 4 % EX LIQD: 60.0000 mL | Freq: Once | CUTANEOUS | Status: DC

## 2018-06-12 MED ORDER — KETOROLAC TROMETHAMINE 15 MG/ML IJ SOLN
15.0000 mg | Freq: Four times a day (QID) | INTRAMUSCULAR | Status: AC
  Administered 2018-06-12 – 2018-06-13 (×3): 15 mg via INTRAVENOUS
  Filled 2018-06-12 (×3): qty 1

## 2018-06-12 MED ORDER — CEFAZOLIN SODIUM-DEXTROSE 2-4 GM/100ML-% IV SOLN
INTRAVENOUS | Status: AC
  Filled 2018-06-12: qty 100

## 2018-06-12 MED ORDER — MENTHOL 3 MG MT LOZG: 1.0000 | LOZENGE | OROMUCOSAL | Status: DC | PRN

## 2018-06-12 MED ORDER — LACTATED RINGERS IV SOLN
INTRAVENOUS | Status: DC
  Administered 2018-06-12 (×2): via INTRAVENOUS

## 2018-06-12 MED ORDER — OXYCODONE HCL 5 MG PO TABS: 5.0000 mg | ORAL_TABLET | ORAL | Status: DC | PRN

## 2018-06-12 MED ORDER — EPHEDRINE SULFATE 50 MG/ML IJ SOLN
INTRAMUSCULAR | Status: AC
  Filled 2018-06-12: qty 1

## 2018-06-12 MED ORDER — BUPIVACAINE IN DEXTROSE 0.75-8.25 % IT SOLN
INTRATHECAL | Status: DC | PRN
  Administered 2018-06-12: 2 mL via INTRATHECAL

## 2018-06-12 MED ORDER — DEXAMETHASONE SODIUM PHOSPHATE 10 MG/ML IJ SOLN
10.0000 mg | Freq: Once | INTRAMUSCULAR | Status: AC
  Administered 2018-06-13: 10 mg via INTRAVENOUS
  Filled 2018-06-12: qty 1

## 2018-06-12 MED ORDER — IRBESARTAN 300 MG PO TABS
300.0000 mg | ORAL_TABLET | Freq: Every day | ORAL | Status: DC
  Administered 2018-06-12 – 2018-06-14 (×3): 300 mg via ORAL
  Filled 2018-06-12 (×3): qty 1

## 2018-06-12 MED ORDER — MAGNESIUM CITRATE PO SOLN: 1.0000 | Freq: Once | ORAL | Status: DC | PRN

## 2018-06-12 MED ORDER — METOCLOPRAMIDE HCL 5 MG PO TABS
5.0000 mg | ORAL_TABLET | Freq: Three times a day (TID) | ORAL | Status: DC | PRN
Start: 2018-06-12 — End: 2018-06-14

## 2018-06-12 MED ORDER — EPHEDRINE SULFATE 50 MG/ML IJ SOLN
INTRAMUSCULAR | Status: DC | PRN
  Administered 2018-06-12: 10 mg via INTRAVENOUS

## 2018-06-12 MED ORDER — PHENYLEPHRINE 40 MCG/ML (10ML) SYRINGE FOR IV PUSH (FOR BLOOD PRESSURE SUPPORT)
PREFILLED_SYRINGE | INTRAVENOUS | Status: AC
  Filled 2018-06-12: qty 10

## 2018-06-12 MED ORDER — ASPIRIN 81 MG PO CHEW
81.0000 mg | CHEWABLE_TABLET | Freq: Two times a day (BID) | ORAL | Status: DC
  Administered 2018-06-13 – 2018-06-14 (×3): 81 mg via ORAL
  Filled 2018-06-12 (×4): qty 1

## 2018-06-12 MED ORDER — VANCOMYCIN HCL 1000 MG IV SOLR
INTRAVENOUS | Status: AC
  Filled 2018-06-12: qty 1000

## 2018-06-12 MED ORDER — SENNOSIDES-DOCUSATE SODIUM 8.6-50 MG PO TABS: 1.0000 | ORAL_TABLET | Freq: Every evening | ORAL | 1 refills | Status: DC | PRN

## 2018-06-12 MED ORDER — METOCLOPRAMIDE HCL 5 MG/ML IJ SOLN: 5.0000 mg | Freq: Three times a day (TID) | INTRAMUSCULAR | Status: DC | PRN

## 2018-06-12 MED ORDER — CEFAZOLIN SODIUM-DEXTROSE 2-4 GM/100ML-% IV SOLN
2.0000 g | Freq: Four times a day (QID) | INTRAVENOUS | Status: AC
  Administered 2018-06-12 – 2018-06-13 (×3): 2 g via INTRAVENOUS
  Filled 2018-06-12 (×3): qty 100

## 2018-06-12 MED ORDER — OXYCODONE HCL ER 10 MG PO T12A: 10.0000 mg | EXTENDED_RELEASE_TABLET | Freq: Two times a day (BID) | ORAL | 0 refills | Status: AC

## 2018-06-12 MED ORDER — GABAPENTIN 300 MG PO CAPS
300.0000 mg | ORAL_CAPSULE | Freq: Three times a day (TID) | ORAL | Status: DC
  Administered 2018-06-12 – 2018-06-14 (×6): 300 mg via ORAL
  Filled 2018-06-12 (×6): qty 1

## 2018-06-12 MED ORDER — SODIUM CHLORIDE 0.9% FLUSH
INTRAVENOUS | Status: DC | PRN
  Administered 2018-06-12: 40 mL

## 2018-06-12 MED ORDER — ONDANSETRON HCL 4 MG PO TABS: 4.0000 mg | ORAL_TABLET | Freq: Four times a day (QID) | ORAL | Status: DC | PRN

## 2018-06-12 MED ORDER — PHENYLEPHRINE HCL 10 MG/ML IJ SOLN
INTRAVENOUS | Status: DC | PRN
  Administered 2018-06-12: 30 ug/min via INTRAVENOUS

## 2018-06-12 MED ORDER — HYDROMORPHONE HCL 1 MG/ML IJ SOLN: 0.5000 mg | INTRAMUSCULAR | Status: DC | PRN

## 2018-06-12 MED ORDER — HYDROCHLOROTHIAZIDE 25 MG PO TABS
25.0000 mg | ORAL_TABLET | Freq: Every day | ORAL | Status: DC
  Filled 2018-06-12 (×3): qty 1

## 2018-06-12 MED ORDER — PHENYLEPHRINE HCL 10 MG/ML IJ SOLN
INTRAMUSCULAR | Status: DC | PRN
  Administered 2018-06-12 (×3): 80 ug via INTRAVENOUS

## 2018-06-12 MED ORDER — PROPOFOL 500 MG/50ML IV EMUL
INTRAVENOUS | Status: DC | PRN
  Administered 2018-06-12: 50 ug/kg/min via INTRAVENOUS

## 2018-06-12 MED ORDER — SODIUM CHLORIDE 0.9 % IR SOLN
Status: DC | PRN
  Administered 2018-06-12: 3000 mL

## 2018-06-12 MED ORDER — ONDANSETRON HCL 4 MG/2ML IJ SOLN
INTRAMUSCULAR | Status: DC | PRN
  Administered 2018-06-12: 4 mg via INTRAVENOUS

## 2018-06-12 MED ORDER — POLYETHYLENE GLYCOL 3350 17 G PO PACK: 17.0000 g | PACK | Freq: Every day | ORAL | Status: DC | PRN

## 2018-06-12 MED ORDER — 0.9 % SODIUM CHLORIDE (POUR BTL) OPTIME
TOPICAL | Status: DC | PRN
  Administered 2018-06-12: 1000 mL

## 2018-06-12 MED ORDER — CELECOXIB 200 MG PO CAPS
200.0000 mg | ORAL_CAPSULE | Freq: Two times a day (BID) | ORAL | Status: DC
  Administered 2018-06-12 – 2018-06-14 (×5): 200 mg via ORAL
  Filled 2018-06-12 (×5): qty 1

## 2018-06-12 MED ORDER — OLMESARTAN MEDOXOMIL-HCTZ 40-25 MG PO TABS: 1.0000 | ORAL_TABLET | Freq: Every day | ORAL | Status: DC

## 2018-06-12 MED ORDER — DIPHENHYDRAMINE HCL 12.5 MG/5ML PO ELIX: 25.0000 mg | ORAL_SOLUTION | ORAL | Status: DC | PRN

## 2018-06-12 MED ORDER — METOPROLOL TARTRATE 50 MG PO TABS
50.0000 mg | ORAL_TABLET | Freq: Two times a day (BID) | ORAL | Status: DC
  Administered 2018-06-12 – 2018-06-14 (×4): 50 mg via ORAL
  Filled 2018-06-12 (×4): qty 1

## 2018-06-12 MED ORDER — PROPOFOL 10 MG/ML IV BOLUS
INTRAVENOUS | Status: AC
  Filled 2018-06-12: qty 20

## 2018-06-12 MED ORDER — METOCLOPRAMIDE HCL 5 MG/ML IJ SOLN: 10.0000 mg | Freq: Once | INTRAMUSCULAR | Status: DC | PRN

## 2018-06-12 MED ORDER — FENTANYL CITRATE (PF) 100 MCG/2ML IJ SOLN
INTRAMUSCULAR | Status: DC | PRN
  Administered 2018-06-12: 100 ug via INTRAVENOUS

## 2018-06-12 MED ORDER — ACETAMINOPHEN 500 MG PO TABS
1000.0000 mg | ORAL_TABLET | Freq: Four times a day (QID) | ORAL | Status: AC
  Administered 2018-06-12 – 2018-06-13 (×4): 1000 mg via ORAL
  Filled 2018-06-12 (×4): qty 2

## 2018-06-12 MED ORDER — METHOCARBAMOL 500 MG PO TABS
500.0000 mg | ORAL_TABLET | Freq: Four times a day (QID) | ORAL | Status: DC | PRN
  Administered 2018-06-14: 500 mg via ORAL
  Filled 2018-06-12: qty 1

## 2018-06-12 MED ORDER — ALUM & MAG HYDROXIDE-SIMETH 200-200-20 MG/5ML PO SUSP: 30.0000 mL | ORAL | Status: DC | PRN

## 2018-06-12 MED ORDER — DOCUSATE SODIUM 100 MG PO CAPS
100.0000 mg | ORAL_CAPSULE | Freq: Two times a day (BID) | ORAL | Status: DC
  Administered 2018-06-12 – 2018-06-14 (×4): 100 mg via ORAL
  Filled 2018-06-12 (×5): qty 1

## 2018-06-12 MED ORDER — ONDANSETRON HCL 4 MG/2ML IJ SOLN
INTRAMUSCULAR | Status: AC
  Filled 2018-06-12: qty 2

## 2018-06-12 MED ORDER — PHENOL 1.4 % MT LIQD: 1.0000 | OROMUCOSAL | Status: DC | PRN

## 2018-06-12 MED ORDER — SORBITOL 70 % SOLN: 30.0000 mL | Freq: Every day | Status: DC | PRN

## 2018-06-12 MED ORDER — MIDAZOLAM HCL 2 MG/2ML IJ SOLN
INTRAMUSCULAR | Status: AC
  Filled 2018-06-12: qty 2

## 2018-06-12 MED ORDER — ONDANSETRON HCL 4 MG PO TABS: 4.0000 mg | ORAL_TABLET | Freq: Three times a day (TID) | ORAL | 0 refills | Status: DC | PRN

## 2018-06-12 MED ORDER — MEPERIDINE HCL 50 MG/ML IJ SOLN: 6.2500 mg | INTRAMUSCULAR | Status: DC | PRN

## 2018-06-12 MED ORDER — ACETAMINOPHEN 325 MG PO TABS: 325.0000 mg | ORAL_TABLET | Freq: Four times a day (QID) | ORAL | Status: DC | PRN

## 2018-06-12 MED ORDER — OXYCODONE HCL 5 MG PO TABS
10.0000 mg | ORAL_TABLET | ORAL | Status: DC | PRN
  Administered 2018-06-13: 10 mg via ORAL
  Filled 2018-06-12: qty 2

## 2018-06-12 MED ORDER — FENTANYL CITRATE (PF) 250 MCG/5ML IJ SOLN
INTRAMUSCULAR | Status: AC
  Filled 2018-06-12: qty 5

## 2018-06-12 MED ORDER — BUPIVACAINE LIPOSOME 1.3 % IJ SUSP
20.0000 mL | INTRAMUSCULAR | Status: DC
  Filled 2018-06-12: qty 20

## 2018-06-12 MED ORDER — ASPIRIN EC 81 MG PO TBEC: 81.0000 mg | DELAYED_RELEASE_TABLET | Freq: Two times a day (BID) | ORAL | 0 refills | Status: DC

## 2018-06-12 MED ORDER — SODIUM CHLORIDE 0.9 % IV SOLN
INTRAVENOUS | Status: DC
  Administered 2018-06-12: 12:00:00 via INTRAVENOUS

## 2018-06-12 MED ORDER — PROMETHAZINE HCL 25 MG PO TABS: 25.0000 mg | ORAL_TABLET | Freq: Four times a day (QID) | ORAL | 1 refills | Status: DC | PRN

## 2018-06-12 MED ORDER — DEXAMETHASONE SODIUM PHOSPHATE 10 MG/ML IJ SOLN
INTRAMUSCULAR | Status: AC
  Filled 2018-06-12: qty 1

## 2018-06-12 MED ORDER — ONDANSETRON HCL 4 MG/2ML IJ SOLN: 4.0000 mg | Freq: Four times a day (QID) | INTRAMUSCULAR | Status: DC | PRN

## 2018-06-12 MED ORDER — FENTANYL CITRATE (PF) 100 MCG/2ML IJ SOLN: 25.0000 ug | INTRAMUSCULAR | Status: DC | PRN

## 2018-06-12 MED ORDER — METHOCARBAMOL 1000 MG/10ML IJ SOLN
500.0000 mg | Freq: Four times a day (QID) | INTRAVENOUS | Status: DC | PRN
  Filled 2018-06-12: qty 5

## 2018-06-12 MED ORDER — AMLODIPINE BESYLATE 5 MG PO TABS
5.0000 mg | ORAL_TABLET | Freq: Every day | ORAL | Status: DC
  Administered 2018-06-12 – 2018-06-13 (×2): 5 mg via ORAL
  Filled 2018-06-12 (×2): qty 1

## 2018-06-12 MED ORDER — OXYCODONE HCL ER 10 MG PO T12A
10.0000 mg | EXTENDED_RELEASE_TABLET | Freq: Two times a day (BID) | ORAL | Status: DC
  Administered 2018-06-12 – 2018-06-14 (×5): 10 mg via ORAL
  Filled 2018-06-12 (×5): qty 1

## 2018-06-12 MED ORDER — OXYCODONE HCL 5 MG PO TABS: 5.0000 mg | ORAL_TABLET | ORAL | 0 refills | Status: DC | PRN

## 2018-06-12 MED ORDER — CLONIDINE HCL (ANALGESIA) 100 MCG/ML EP SOLN
EPIDURAL | Status: DC | PRN
  Administered 2018-06-12: 100 ug

## 2018-06-12 MED ORDER — DEXAMETHASONE SODIUM PHOSPHATE 10 MG/ML IJ SOLN
INTRAMUSCULAR | Status: DC | PRN
  Administered 2018-06-12: 10 mg via INTRAVENOUS

## 2018-06-12 MED ORDER — TRANEXAMIC ACID 1000 MG/10ML IV SOLN
1000.0000 mg | Freq: Once | INTRAVENOUS | Status: AC
  Administered 2018-06-12: 1000 mg via INTRAVENOUS
  Filled 2018-06-12: qty 10

## 2018-06-12 MED ORDER — CEFAZOLIN SODIUM-DEXTROSE 2-4 GM/100ML-% IV SOLN
2.0000 g | INTRAVENOUS | Status: AC
  Administered 2018-06-12: 2 g via INTRAVENOUS

## 2018-06-12 MED ORDER — POLYVINYL ALCOHOL 1.4 % OP SOLN
1.0000 [drp] | Freq: Two times a day (BID) | OPHTHALMIC | Status: DC
Start: 2018-06-12 — End: 2018-06-14
  Administered 2018-06-13: 1 [drp] via OPHTHALMIC
  Filled 2018-06-12: qty 15

## 2018-06-12 MED ORDER — METHOCARBAMOL 500 MG PO TABS: 500.0000 mg | ORAL_TABLET | Freq: Four times a day (QID) | ORAL | 2 refills | Status: DC | PRN

## 2018-06-12 MED ORDER — VANCOMYCIN HCL 1000 MG IV SOLR
INTRAVENOUS | Status: DC | PRN
  Administered 2018-06-12: 1000 mg via TOPICAL

## 2018-06-12 MED ORDER — SODIUM CHLORIDE 0.9 % IV SOLN
INTRAVENOUS | Status: DC | PRN
  Administered 2018-06-12: 07:00:00 via INTRAVENOUS

## 2018-06-12 MED ORDER — BUPIVACAINE LIPOSOME 1.3 % IJ SUSP
INTRAMUSCULAR | Status: DC | PRN
  Administered 2018-06-12: 20 mL

## 2018-06-12 SURGICAL SUPPLY — 74 items
ALCOHOL ISOPROPYL (RUBBING) (MISCELLANEOUS) ×2 IMPLANT
BAG DECANTER FOR FLEXI CONT (MISCELLANEOUS) ×2 IMPLANT
BANDAGE ACE 6X5 VEL STRL LF (GAUZE/BANDAGES/DRESSINGS) ×2 IMPLANT
BANDAGE ESMARK 6X9 LF (GAUZE/BANDAGES/DRESSINGS) ×1 IMPLANT
BASEPLATE TIBIAL RIGHT SZ 7 (Knees) ×1 IMPLANT
BENZOIN TINCTURE PRP APPL 2/3 (GAUZE/BANDAGES/DRESSINGS) ×2 IMPLANT
BLADE SAW SGTL 13.0X1.19X90.0M (BLADE) ×2 IMPLANT
BNDG ELASTIC 6X10 VLCR STRL LF (GAUZE/BANDAGES/DRESSINGS) ×2 IMPLANT
BNDG ESMARK 6X9 LF (GAUZE/BANDAGES/DRESSINGS) ×2
BOWL SMART MIX CTS (DISPOSABLE) ×2 IMPLANT
CEMENT HV SMART SET (Cement) ×4 IMPLANT
CLSR STERI-STRIP ANTIMIC 1/2X4 (GAUZE/BANDAGES/DRESSINGS) ×2 IMPLANT
COMP PATELLA GENESIS 38X9 (Knees) ×2 IMPLANT
COMPONENT PATELLA GENESIS 38X9 (Knees) ×1 IMPLANT
COVER SURGICAL LIGHT HANDLE (MISCELLANEOUS) ×2 IMPLANT
CUFF TOURNIQUET SINGLE 34IN LL (TOURNIQUET CUFF) ×2 IMPLANT
CUFF TOURNIQUET SINGLE 44IN (TOURNIQUET CUFF) IMPLANT
DRAPE EXTREMITY T 121X128X90 (DRAPE) ×2 IMPLANT
DRAPE HALF SHEET 40X57 (DRAPES) ×2 IMPLANT
DRAPE INCISE IOBAN 66X45 STRL (DRAPES) IMPLANT
DRAPE ORTHO SPLIT 77X108 STRL (DRAPES) ×2
DRAPE POUCH INSTRU U-SHP 10X18 (DRAPES) ×2 IMPLANT
DRAPE SURG 17X11 SM STRL (DRAPES) ×4 IMPLANT
DRAPE SURG ORHT 6 SPLT 77X108 (DRAPES) ×2 IMPLANT
DRSG AQUACEL AG ADV 3.5X10 (GAUZE/BANDAGES/DRESSINGS) ×2 IMPLANT
DRSG AQUACEL AG ADV 3.5X14 (GAUZE/BANDAGES/DRESSINGS) ×2 IMPLANT
DURAPREP 26ML APPLICATOR (WOUND CARE) ×4 IMPLANT
ELECT CAUTERY BLADE 6.4 (BLADE) ×2 IMPLANT
ELECT REM PT RETURN 9FT ADLT (ELECTROSURGICAL) ×2
ELECTRODE REM PT RTRN 9FT ADLT (ELECTROSURGICAL) ×1 IMPLANT
FEMUR OXINIUM SZ 7 (Knees) ×2 IMPLANT
GLOVE BIOGEL PI IND STRL 7.0 (GLOVE) ×1 IMPLANT
GLOVE BIOGEL PI INDICATOR 7.0 (GLOVE) ×1
GLOVE ECLIPSE 7.0 STRL STRAW (GLOVE) ×2 IMPLANT
GLOVE SKINSENSE NS SZ7.5 (GLOVE) ×1
GLOVE SKINSENSE STRL SZ7.5 (GLOVE) ×1 IMPLANT
GLOVE SURG SYN 7.5  E (GLOVE) ×4
GLOVE SURG SYN 7.5 E (GLOVE) ×4 IMPLANT
GOWN STRL REIN XL XLG (GOWN DISPOSABLE) ×2 IMPLANT
GOWN STRL REUS W/ TWL LRG LVL3 (GOWN DISPOSABLE) ×1 IMPLANT
GOWN STRL REUS W/TWL LRG LVL3 (GOWN DISPOSABLE) ×1
HANDPIECE INTERPULSE COAX TIP (DISPOSABLE) ×1
HOOD PEEL AWAY FLYTE STAYCOOL (MISCELLANEOUS) ×4 IMPLANT
INSERT ARTI HIGH FLEX 7-8 9MM (Knees) ×2 IMPLANT
INSERT SPEED PIN RIMMED 45MM (Pin) ×6 IMPLANT
KIT BASIN OR (CUSTOM PROCEDURE TRAY) ×2 IMPLANT
KIT TURNOVER KIT B (KITS) ×2 IMPLANT
MANIFOLD NEPTUNE II (INSTRUMENTS) ×2 IMPLANT
MARKER SKIN DUAL TIP RULER LAB (MISCELLANEOUS) ×2 IMPLANT
NEEDLE SPNL 18GX3.5 QUINCKE PK (NEEDLE) ×2 IMPLANT
NS IRRIG 1000ML POUR BTL (IV SOLUTION) ×2 IMPLANT
PACK TOTAL JOINT (CUSTOM PROCEDURE TRAY) ×2 IMPLANT
PAD ARMBOARD 7.5X6 YLW CONV (MISCELLANEOUS) ×4 IMPLANT
PIN TROCAR 3 INCH (PIN) ×8 IMPLANT
PIN TROCAR 5 (PIN) ×8 IMPLANT
SAW OSC TIP CART 19.5X105X1.3 (SAW) ×2 IMPLANT
SET HNDPC FAN SPRY TIP SCT (DISPOSABLE) ×1 IMPLANT
STAPLER VISISTAT 35W (STAPLE) IMPLANT
SUCTION FRAZIER HANDLE 10FR (MISCELLANEOUS) ×1
SUCTION TUBE FRAZIER 10FR DISP (MISCELLANEOUS) ×1 IMPLANT
SUT ETHILON 2 0 FS 18 (SUTURE) IMPLANT
SUT MNCRL AB 4-0 PS2 18 (SUTURE) IMPLANT
SUT VIC AB 0 CT1 27 (SUTURE) ×2
SUT VIC AB 0 CT1 27XBRD ANBCTR (SUTURE) ×2 IMPLANT
SUT VIC AB 1 CTX 27 (SUTURE) ×6 IMPLANT
SUT VIC AB 2-0 CT1 27 (SUTURE) ×3
SUT VIC AB 2-0 CT1 TAPERPNT 27 (SUTURE) ×3 IMPLANT
SYR 50ML LL SCALE MARK (SYRINGE) ×2 IMPLANT
TIBIAL BASEPLATE RIGHT SZ 7 (Knees) ×2 IMPLANT
TOWEL OR 17X24 6PK STRL BLUE (TOWEL DISPOSABLE) ×2 IMPLANT
TOWEL OR 17X26 10 PK STRL BLUE (TOWEL DISPOSABLE) ×2 IMPLANT
TRAY CATH 16FR W/PLASTIC CATH (SET/KITS/TRAYS/PACK) IMPLANT
UNDERPAD 30X30 (UNDERPADS AND DIAPERS) ×2 IMPLANT
WRAP KNEE MAXI GEL POST OP (GAUZE/BANDAGES/DRESSINGS) ×2 IMPLANT

## 2018-06-12 NOTE — Anesthesia Procedure Notes (Deleted)
Spinal  Patient location during procedure: OR Staffing Anesthesiologist: Montez Hageman, MD Performed: anesthesiologist  Preanesthetic Checklist Completed: patient identified, site marked, surgical consent, pre-op evaluation, timeout performed, IV checked, risks and benefits discussed and monitors and equipment checked Spinal Block Patient position: right lateral decubitus Prep: Betadine Patient monitoring: heart rate, continuous pulse ox and blood pressure Approach: right paramedian Location: L3-4 Injection technique: single-shot Needle Needle type: Spinocan  Needle gauge: 22 G Needle length: 9 cm Additional Notes Expiration date of kit checked and confirmed. Patient tolerated procedure well, without complications.

## 2018-06-12 NOTE — Evaluation (Signed)
Physical Therapy Evaluation Patient Details Name: Craig Mann MRN: [id number] DOB: 08/31/1933 Today's Date: 06/12/2018   History of Present Illness  Pt s/p rt TKR. PMH - lt TKR, lt THR, rt THR, rt rotator cuff repair x 2, htn, afib, macular degeneration  Clinical Impression  Pt doing well with mobility s/p rt TKR. Pt motivated to work toward return to independence.     Follow Up Recommendations Follow surgeon's recommendation for DC plan and follow-up therapies    Equipment Recommendations  Rolling walker with 5" wheels;3in1 (PT)    Recommendations for Other Services       Precautions / Restrictions Precautions Precautions: Knee Restrictions Weight Bearing Restrictions: Yes RLE Weight Bearing: Weight bearing as tolerated      Mobility  Bed Mobility Overal bed mobility: Modified Independent             General bed mobility comments: Incr time  Transfers Overall transfer level: Needs assistance Equipment used: Rolling walker (2 wheeled) Transfers: Sit to/from Stand Sit to Stand: Min guard         General transfer comment: Asssist for safety and verbal cues for hand placement  Ambulation/Gait Ambulation/Gait assistance: Min guard;Supervision Gait Distance (Feet): 150 Feet Assistive device: Rolling walker (2 wheeled) Gait Pattern/deviations: Step-through pattern;Decreased stride length Gait velocity: decr Gait velocity interpretation: 1.31 - 2.62 ft/sec, indicative of limited community ambulator General Gait Details: Initially min guard but pt progressed to supervision  Stairs            Wheelchair Mobility    Modified Rankin (Stroke Patients Only)       Balance Overall balance assessment: No apparent balance deficits (not formally assessed)                                           Pertinent Vitals/Pain Pain Assessment: 0-10 Pain Score: 4  Pain Location: rt knee Pain Descriptors / Indicators: Aching Pain  Intervention(s): Limited activity within patient's tolerance;Monitored during session;Repositioned    Home Living Family/patient expects to be discharged to:: Private residence Living Arrangements: Other relatives(staying with brother) Available Help at Discharge: Family Type of Home: House Home Access: Stairs to enter Entrance Stairs-Rails: Doctor, general practiceight;Left Entrance Stairs-Number of Steps: 3 Home Layout: Two level Home Equipment: None Additional Comments: [place] here to have surgery and recover at his brother's house.    Prior Function Level of Independence: Independent               Hand Dominance        Extremity/Trunk Assessment   Upper Extremity Assessment Upper Extremity Assessment: Overall WFL for tasks assessed    Lower Extremity Assessment Lower Extremity Assessment: RLE deficits/detail RLE Deficits / Details: fair quad set. able to SLR. AROM knee 5-90       Communication   Communication: No difficulties  Cognition Arousal/Alertness: Awake/alert Behavior During Therapy: WFL for tasks assessed/performed Overall Cognitive Status: Within Functional Limits for tasks assessed                                        General Comments      Exercises Total Joint Exercises Quad Sets: Right;5 reps;Supine Straight Leg Raises: Right;5 reps;Supine;Strengthening Long Arc Quad: Strengthening;Right;5 reps;Seated Knee Flexion: AROM;Right;5 reps;Seated Goniometric ROM: 5-90   Assessment/Plan  PT Assessment Patient needs continued PT services  PT Problem List Decreased strength;Decreased range of motion;Decreased mobility;Decreased knowledge of use of DME;Pain       PT Treatment Interventions DME instruction;Gait training;Stair training;Functional mobility training;Therapeutic activities;Therapeutic exercise;Patient/family education    PT Goals (Current goals can be found in the Care Plan section)  Acute Rehab PT Goals Patient  Stated Goal: return home PT Goal Formulation: With patient Time For Goal Achievement: 06/19/18 Potential to Achieve Goals: Good    Frequency 7X/week   Barriers to discharge Inaccessible home environment stairs to enter    Co-evaluation               AM-PAC PT "6 Clicks" Daily Activity  Outcome Measure Difficulty turning over in bed (including adjusting bedclothes, sheets and blankets)?: A Little Difficulty moving from lying on back to sitting on the side of the bed? : A Little Difficulty sitting down on and standing up from a chair with arms (e.g., wheelchair, bedside commode, etc,.)?: A Little Help needed moving to and from a bed to chair (including a wheelchair)?: A Little Help needed walking in hospital room?: A Little Help needed climbing 3-5 steps with a railing? : A Little 6 Click Score: 18    End of Session Equipment Utilized During Treatment: Gait belt Activity Tolerance: Patient tolerated treatment well Patient left: in chair;with call bell/phone within reach Nurse Communication: Mobility status PT Visit Diagnosis: Other abnormalities of gait and mobility (R26.89);Pain Pain - Right/Left: Right Pain - part of body: Knee    Time: 1859-1925 PT Time Calculation (min) (ACUTE ONLY): 26 min   Charges:   PT Evaluation $PT Eval Low Complexity: 1 Low PT Treatments $Gait Training: 8-22 mins   PT G Codes:        [facility] PT [phone]   Angelina Ok [facility] 06/12/2018, 8:21 PM

## 2018-06-12 NOTE — Transfer of Care (Signed)
Immediate Anesthesia Transfer of Care Note  Patient: Craig Mann  Procedure(s) Performed: RIGHT TOTAL KNEE ARTHROPLASTY (Right Knee)  Patient Location: PACU  Anesthesia Type:MAC and Spinal  Level of Consciousness: awake, alert , oriented and sedated  Airway & Oxygen Therapy: Patient Spontanous Breathing and Patient connected to face mask oxygen  Post-op Assessment: Report given to RN, Post -op Vital signs reviewed and stable and Patient moving all extremities  Post vital signs: Reviewed and stable  Last Vitals:  Vitals Value Taken Time  BP 91/57 06/12/2018  9:54 AM  Temp    Pulse 59 06/12/2018  9:57 AM  Resp 15 06/12/2018  9:57 AM  SpO2 97 % 06/12/2018  9:57 AM  Vitals shown include unvalidated device data.  Last Pain:  Vitals:   06/12/18 7867  TempSrc: Oral  PainSc:       Patients Stated Pain Goal: 3 (67/20/94 7096)  Complications: No apparent anesthesia complications

## 2018-06-12 NOTE — Anesthesia Procedure Notes (Signed)
Spinal  Patient location during procedure: OR Start time: 06/12/2018 7:39 AM End time: 06/12/2018 7:42 AM Staffing Anesthesiologist: Val EagleMoser, Sydny Schnitzler, MD Performed: anesthesiologist  Preanesthetic Checklist Completed: patient identified, surgical consent, pre-op evaluation, timeout performed, IV checked, risks and benefits discussed and monitors and equipment checked Spinal Block Patient position: sitting Prep: site prepped and draped and DuraPrep Patient monitoring: cardiac monitor, continuous pulse ox, blood pressure and heart rate Approach: midline Location: L3-4 Injection technique: single-shot Needle Needle type: Pencan  Needle gauge: 24 G Needle length: 9 cm Assessment Sensory level: T6 Additional Notes Functioning IV was confirmed and monitors were applied. Sterile prep and drape, including hand hygiene and sterile gloves were used. The patient was positioned and the spine was prepped. The skin was anesthetized with lidocaine.  Free flow of clear CSF was obtained prior to injecting local anesthetic into the CSF.  The spinal needle aspirated freely following injection.  The needle was carefully withdrawn.  The patient tolerated the procedure well.

## 2018-06-12 NOTE — Op Note (Addendum)
Total Knee Arthroplasty Procedure Note  Preoperative diagnosis: Right knee osteoarthritis  Postoperative diagnosis:same  Operative procedure: Right total knee arthroplasty. CPT [phone]  Surgeon: N. Glee ArvinMichael Emerly Prak, MD  Assist: Hart CarwinJustin Queen, RNFA  Anesthesia: Spinal, regional  Tourniquet time: 60 mins  Implants used: [employer] Femur: PS 7 Tibia: 7 Patella: 38 mm, 9 mm thick Polyethylene: 9 mm  Indication: Craig EchevariaMichael O Mann is a 84 y.o. year old male with a history of knee pain. Having failed conservative management, the patient elected to proceed with a total knee arthroplasty.  We have reviewed the risk and benefits of the surgery and they elected to proceed after voicing understanding.  Procedure:  After informed consent was obtained and understanding of the risk were voiced including but not limited to bleeding, infection, damage to surrounding structures including nerves and vessels, blood clots, leg length inequality and the failure to achieve desired results, the operative extremity was marked with verbal confirmation of the patient in the holding area.   The patient was then brought to the operating room and transported to the operating room table in the supine position.  A tourniquet was applied to the operative extremity around the upper thigh. The operative limb was then prepped and draped in the usual sterile fashion and preoperative antibiotics were administered.  A time out was performed prior to the start of surgery confirming the correct extremity, preoperative antibiotic administration, as well as team members, implants and instruments available for the case. Correct surgical site was also confirmed with preoperative radiographs. The limb was then elevated for exsanguination and the tourniquet was inflated. A midline incision was made and a standard medial parapatellar approach was performed.  The patella was prepared and sized to a 38 mm.  A cover was placed on  the patella for protection from retractors.  We then turned our attention to the femur. Posterior cruciate ligament was sacrificed. Start site was drilled in the femur and the intramedullary distal femoral cutting guide was placed, set at 5 degrees valgus, taking 11 mm of distal resection. The distal cut was made. Osteophytes were then removed. Next, the proximal tibial cutting guide was placed with appropriate slope, varus/valgus alignment and depth of resection. The proximal tibial cut was made. Gap blocks were then used to assess the extension gap and alignment, and appropriate soft tissue releases were performed. Attention was turned back to the femur, which was sized using the sizing guide to a size 7. Appropriate rotation of the femoral component was determined using epicondylar axis, Whiteside's line, and assessing the flexion gap under ligament tension. The appropriate size 4-in-1 cutting block was placed and cuts were made. Posterior femoral osteophytes and uncapped bone were then removed with the curved osteotome. The tibia was sized for a size 7 component. The femoral box-cutting guide was placed and prepared for a PS femoral component. Trial components were placed, and stability was checked in full extension, mid-flexion, and deep flexion. Proper tibial rotation was determined and marked.  The patella tracked well without a lateral release. Trial components were then removed and tibial preparation performed. A posterior capsular injection comprising of 20 cc of 1.3% exparel and 40 cc of normal saline was performed for postoperative pain control. The bony surfaces were irrigated with a pulse lavage and then dried. Bone cement was vacuum mixed on the back table, and the final components sized above were cemented into place. After cement had finished curing, excess cement was removed. The stability of the  construct was re-evaluated throughout a range of motion and found to be acceptable. The trial liner  was removed, the knee was copiously irrigated, and the knee was re-evaluated for any excess bone debris. The real polyethylene liner, 9 mm thick, was inserted and checked to ensure the locking mechanism had engaged appropriately. The tourniquet was deflated and hemostasis was achieved. The wound was irrigated with normal saline.  One gram of vancomycin powder was placed in the surgical bed. A drain was not placed. Capsular closure was performed with a #1 vicryl, subcutaneous fat closed with a 0 vicryl suture, then subcutaneous tissue closed with interrupted 2.0 vicryl suture. The skin was then closed with a 3.0 monocryl. A sterile dressing was applied.  The patient was awakened in the operating room and taken to recovery in stable condition. All sponge, needle, and instrument counts were correct at the end of the case.  Position: supine  Complications: none.  Time Out: performed   Drains/Packing: none  Estimated blood loss: minimal  Returned to Recovery Room: in good condition.   Antibiotics: yes   Mechanical VTE (DVT) Prophylaxis: sequential compression devices, TED thigh-high  Chemical VTE (DVT) Prophylaxis: aspirin  Fluid Replacement  Crystalloid: see anesthesia record Blood: none  FFP: none   Specimens Removed: 1 to pathology   Sponge and Instrument Count Correct? yes   PACU: portable radiograph - knee AP and Lateral   Admission: inpatient status  Plan/RTC: Return in 2 weeks for wound check.   Weight Bearing/Load Lower Extremity: full   N. Glee Arvin, MD [facility] [phone] 9:09 AM

## 2018-06-12 NOTE — Progress Notes (Signed)
Report given to philip lopez rn as caregiver 

## 2018-06-12 NOTE — Anesthesia Procedure Notes (Signed)
Anesthesia Regional Block: Adductor canal block   Pre-Anesthetic Checklist: ,, timeout performed, Correct Patient, Correct Site, Correct Laterality, Correct Procedure, Correct Position, site marked, Risks and benefits discussed,  Surgical consent,  Pre-op evaluation,  At surgeon's request and post-op pain management  Laterality: Right and Lower  Prep: Maximum Sterile Barrier Precautions used, chloraprep       Needles:  Injection technique: Single-shot  Needle Type: Echogenic Stimulator Needle     Needle Length: 10cm      Additional Needles:   Procedures:,,,, ultrasound used (permanent image in chart),,,,  Narrative:  Start time: 06/12/2018 7:00 AM End time: 06/12/2018 7:10 AM Injection made incrementally with aspirations every 5 mL.  Performed by: Personally  Anesthesiologist: Phillips Groutarignan, Benedicta Sultan, MD  Additional Notes: Risks, benefits and alternative to block explained extensively.  Patient tolerated procedure well, without complications.

## 2018-06-12 NOTE — Anesthesia Preprocedure Evaluation (Signed)
Anesthesia Evaluation  Patient identified by MRN, date of birth, ID band Patient awake    Reviewed: Allergy & Precautions, NPO status , Patient's Chart, lab work & pertinent test results  Airway Mallampati: II  TM Distance: >3 FB Neck ROM: Full    Dental no notable dental hx.    Pulmonary former smoker,    Pulmonary exam normal breath sounds clear to auscultation       Cardiovascular hypertension, Pt. on medications Normal cardiovascular exam Rhythm:Regular Rate:Normal     Neuro/Psych negative neurological ROS  negative psych ROS   GI/Hepatic negative GI ROS, Neg liver ROS,   Endo/Other  negative endocrine ROS  Renal/GU negative Renal ROS  negative genitourinary   Musculoskeletal negative musculoskeletal ROS (+)   Abdominal   Peds negative pediatric ROS (+)  Hematology negative hematology ROS (+)   Anesthesia Other Findings   Reproductive/Obstetrics negative OB ROS                             Anesthesia Physical Anesthesia Plan  ASA: II  Anesthesia Plan: Spinal   Post-op Pain Management:  Regional for Post-op pain   Induction: Intravenous  PONV Risk Score and Plan: 1 and Ondansetron and Treatment may vary due to age or medical condition  Airway Management Planned: Simple Face Mask  Additional Equipment:   Intra-op Plan:   Post-operative Plan:   Informed Consent: I have reviewed the patients History and Physical, chart, labs and discussed the procedure including the risks, benefits and alternatives for the proposed anesthesia with the patient or authorized representative who has indicated his/her understanding and acceptance.   Dental advisory given  Plan Discussed with: CRNA  Anesthesia Plan Comments:         Anesthesia Quick Evaluation

## 2018-06-12 NOTE — Discharge Instructions (Signed)

## 2018-06-12 NOTE — Telephone Encounter (Signed)
Patients brother called and is wanting someone to call him back regarding patients surgery today with Dr. Vaughan SineXu. Jess' # [phone]

## 2018-06-12 NOTE — Progress Notes (Signed)
Orthopedic Tech Progress Note Patient Details:  Craig Mann 5[name]/31/1934 [id number]  CPM Right Knee CPM Right Knee: On Right Knee Flexion (Degrees): 90 Right Knee Extension (Degrees): 0 Additional Comments: trapeze bar patient helper  Post Interventions Patient Tolerated: Well Instructions Provided: Care of device Viewed order from doctor's order list Nikki DomCrawford, Kalan Rinn 06/12/2018, 10:27 AM

## 2018-06-12 NOTE — Telephone Encounter (Signed)
See message please call.  

## 2018-06-12 NOTE — Anesthesia Postprocedure Evaluation (Signed)
Anesthesia Post Note  Patient: Craig Mann  Procedure(s) Performed: RIGHT TOTAL KNEE ARTHROPLASTY (Right Knee)     Patient location during evaluation: PACU Anesthesia Type: Spinal Level of consciousness: awake and alert Pain management: pain level controlled Vital Signs Assessment: post-procedure vital signs reviewed and stable Respiratory status: spontaneous breathing and respiratory function stable Cardiovascular status: blood pressure returned to baseline and stable Postop Assessment: no headache, no backache, spinal receding and no apparent nausea or vomiting Anesthetic complications: no    Last Vitals:  Vitals:   06/12/18 1115 06/12/18 1131  BP:  128/74  Pulse: 61 62  Resp: 17 16  Temp: (!) 36.2 C (!) 36.3 C  SpO2: 99% 96%    Last Pain:  Vitals:   06/12/18 1131  TempSrc: Oral  PainSc:                  Phillips Groutarignan, Rosabella Edgin

## 2018-06-12 NOTE — H&P (Signed)
PREOPERATIVE H&P  Chief Complaint: right knee degenerative joint disease  HPI: Craig EchevariaMichael O Mann is a 84 y.o. male who presents for surgical treatment of right knee degenerative joint disease.  He denies any changes in medical history.  Past Medical History:  Diagnosis Date  . A-fib (HCC)   . Arthritis   . Hypertension   . Macular degeneration of left eye   . Primary localized osteoarthritis of right knee    Past Surgical History:  Procedure Laterality Date  . ATRIAL FIBRILLATION ABLATION  03/2016  . FOOT ARTHRODESIS, MODIFIED MCBRIDE Right 05/1983  . HERNIA REPAIR Left 07/2012   DOUBLE HERNIA  . HERNIA REPAIR Right 05/1997  . HIP SURGERY Left 04/1999 AND 10/1994   REPLACEMENT X2  . KNEE SURGERY Left 04/1998   CARTILAGE  . NOSE SURGERY  1959  . ROTATOR CUFF REPAIR Right 07/2002   SHOULDER  . TOTAL KNEE ARTHROPLASTY Left 10/1999 and 1969   TIMES 12/6107,60454/1963,1962   Social History   Socioeconomic History  . Marital status: Married    Spouse name: Not on file  . Number of children: Not on file  . Years of education: Not on file  . Highest education level: Not on file  Occupational History  . Not on file  Social Needs  . Financial resource strain: Not on file  . Food insecurity:    Worry: Not on file    Inability: Not on file  . Transportation needs:    Medical: Not on file    Non-medical: Not on file  Tobacco Use  . Smoking status: Former Games developermoker  . Smokeless tobacco: Former Engineer, waterUser  Substance and Sexual Activity  . Alcohol use: Yes    Alcohol/week: 0.0 oz  . Drug use: No  . Sexual activity: Not on file  Lifestyle  . Physical activity:    Days per week: Not on file    Minutes per session: Not on file  . Stress: Not on file  Relationships  . Social connections:    Talks on phone: Not on file    Gets together: Not on file    Attends religious service: Not on file    Active member of club or organization: Not on file    Attends meetings of clubs or organizations:  Not on file    Relationship status: Not on file  Other Topics Concern  . Not on file  Social History Narrative  . Not on file   Family History  Problem Relation Age of Onset  . Kidney failure Brother    No Known Allergies Prior to Admission medications   Medication Sig Start Date End Date Taking? Authorizing Provider  acetaminophen (TYLENOL 8 HOUR ARTHRITIS PAIN) 650 MG CR tablet Take 650 mg by mouth 2 (two) times daily as needed for pain.   Yes [provider]  amLODipine (NORVASC) 5 MG tablet Take 5 mg by mouth daily with supper.   Yes [provider]  metoprolol tartrate (LOPRESSOR) 50 MG tablet Take 50 mg by mouth 2 (two) times daily. With breakfast & supper   Yes [provider]  Multiple Vitamins-Minerals (OCUVITE-LUTEIN PO) Take 1 tablet by mouth 2 (two) times daily.   Yes [provider]  Multiple Vitamins-Minerals (PRESERVISION AREDS 2 PO) Take 1 tablet by mouth 2 (two) times daily.   Yes [provider]  olmesartan-hydrochlorothiazide (BENICAR HCT) 40-25 MG tablet Take 1 tablet by mouth daily with breakfast. 04/28/18  Yes [provider]  Polyethyl Glycol-Propyl Glycol (SYSTANE) 0.4-0.3 % SOLN Place 1 drop into both eyes 2 (two) times daily. SYSTANE   Yes [provider]     Positive ROS: All other systems have been reviewed and were otherwise negative with the exception of those mentioned in the HPI and as above.  Physical Exam: General: Alert, no acute distress Cardiovascular: No pedal edema Respiratory: No cyanosis, no use of accessory musculature GI: abdomen soft Skin: No lesions in the area of chief complaint Neurologic: Sensation intact distally Psychiatric: Patient is competent for consent with normal mood and affect Lymphatic: no lymphedema  MUSCULOSKELETAL: exam stable  Assessment: right knee degenerative joint disease  Plan: Plan for Procedure(s): RIGHT TOTAL KNEE ARTHROPLASTY  The risks  benefits and alternatives were discussed with the patient including but not limited to the risks of nonoperative treatment, versus surgical intervention including infection, bleeding, nerve injury,  blood clots, cardiopulmonary complications, morbidity, mortality, among others, and they were willing to proceed.   Preoperative templating of the joint replacement has been completed, documented, and submitted to the Operating Room personnel in order to optimize intra-operative equipment management.  Anticipated LOS equal to or greater than 2 midnights due to - Age 44 and older with one or more of the following:  - Obesity  - Expected need for hospital services (PT, OT, Nursing) required for safe  discharge  - Anticipated need for postoperative skilled nursing care or inpatient rehab  - Active co-morbidities: None  Glee Arvin, MD   06/12/2018 6:42 AM

## 2018-06-13 ENCOUNTER — Other Ambulatory Visit: Payer: Self-pay

## 2018-06-13 ENCOUNTER — Encounter (HOSPITAL_COMMUNITY): Payer: Self-pay | Admitting: Orthopaedic Surgery

## 2018-06-13 LAB — CBC
HCT: 38.2 % — ABNORMAL LOW (ref 39.0–52.0)
Hemoglobin: 12.6 g/dL — ABNORMAL LOW (ref 13.0–17.0)
MCH: 30.3 pg (ref 26.0–34.0)
MCHC: 33 g/dL (ref 30.0–36.0)
MCV: 91.8 fL (ref 78.0–100.0)
PLATELETS: 129 10*3/uL — AB (ref 150–400)
RBC: 4.16 MIL/uL — ABNORMAL LOW (ref 4.22–5.81)
RDW: 13.6 % (ref 11.5–15.5)
WBC: 11.5 10*3/uL — AB (ref 4.0–10.5)

## 2018-06-13 LAB — BASIC METABOLIC PANEL
Anion gap: 7 (ref 5–15)
BUN: 33 mg/dL — AB (ref 8–23)
CALCIUM: 8.6 mg/dL — AB (ref 8.9–10.3)
CO2: 24 mmol/L (ref 22–32)
Chloride: 104 mmol/L (ref 98–111)
Creatinine, Ser: 1.92 mg/dL — ABNORMAL HIGH (ref 0.61–1.24)
GFR calc Af Amer: 36 mL/min — ABNORMAL LOW (ref >60)
GFR, EST NON AFRICAN AMERICAN: 31 mL/min — AB (ref >60)
GLUCOSE: 168 mg/dL — AB (ref 70–99)
Potassium: 4.3 mmol/L (ref 3.5–5.1)
Sodium: 135 mmol/L (ref 135–145)

## 2018-06-13 NOTE — Progress Notes (Signed)
Subjective: 1 Day Post-Op Procedure(s) (LRB): RIGHT TOTAL KNEE ARTHROPLASTY (Right) Patient reports pain as mild.    Objective: Vital signs in last 24 hours: Temp:  [97.2 F (36.2 C)-98.1 F (36.7 C)] 97.7 F (36.5 C) (07/02 0555) Pulse Rate:  [55-71] 57 (07/02 0555) Resp:  [9-21] 14 (07/01 2100) BP: (91-128)/(57-75) 105/69 (07/02 0555) SpO2:  [93 %-100 %] 97 % (07/02 0555)  Intake/Output from previous day: 07/01 0701 - 07/02 0700 In: 2740.9 [P.O.:720; I.V.:1810.9; IV Piggyback:210] Out: 1450 [Urine:1400; Blood:50] Intake/Output this shift: No intake/output data recorded.  Recent Labs    06/13/18 0349  HGB 12.6*   Recent Labs    06/13/18 0349  WBC 11.5*  RBC 4.16*  HCT 38.2*  PLT 129*   Recent Labs    06/13/18 0349  NA 135  K 4.3  CL 104  CO2 24  BUN 33*  CREATININE 1.92*  GLUCOSE 168*  CALCIUM 8.6*   No results for input(s): LABPT, INR in the last 72 hours.  Neurologically intact Neurovascular intact Sensation intact distally Intact pulses distally Dorsiflexion/Plantar flexion intact Incision: dressing C/D/I No cellulitis present Compartment soft  Anticipated LOS equal to or greater than 2 midnights due to - Age 82 and older with one or more of the following:  - Obesity  - Expected need for hospital services (PT, OT, Nursing) required for safe  discharge  - Anticipated need for postoperative skilled nursing care or inpatient rehab  - Active co-morbidities: a-fibb OR   - Unanticipated findings during/Post Surgery: Slow post-op progression: GI, pain control, mobility  - Patient is a high risk of re-admission due to: None   Assessment/Plan: 1 Day Post-Op Procedure(s) (LRB): RIGHT TOTAL KNEE ARTHROPLASTY (Right) Advance diet Up with therapy Plan for discharge tomorrow likely home with hhpt (doing well with PT and has brother at home when d/c). Tentative plan is to stay tonight and go home tomorrow WBAT RLE    Craig Mann 06/13/2018,  7:14 AM

## 2018-06-13 NOTE — Progress Notes (Signed)
Physical Therapy Treatment Patient Details Name: Craig Mann MRN: [id number] DOB: 1933-09-12 Today's Date: 06/13/2018    History of Present Illness Pt s/p rt TKR. PMH - lt TKR, lt THR, rt THR, rt rotator cuff repair x 2, htn, afib, macular degeneration    PT Comments    Pt performed increased gait with good quality.  Progressed patient through HEP.  Pt tolerated session well.  Will f/u in pm for stair training.     Follow Up Recommendations  Follow surgeon's recommendation for DC plan and follow-up therapies     Equipment Recommendations  Rolling walker with 5" wheels;3in1 (PT)    Recommendations for Other Services       Precautions / Restrictions Precautions Precautions: Knee Restrictions Weight Bearing Restrictions: Yes RLE Weight Bearing: Weight bearing as tolerated    Mobility  Bed Mobility Overal bed mobility: Modified Independent             General bed mobility comments: Incr time  Transfers Overall transfer level: Needs assistance Equipment used: Rolling walker (2 wheeled) Transfers: Sit to/from Stand Sit to Stand: Min guard         General transfer comment: Asssist for safety and verbal cues for hand placement  Ambulation/Gait Ambulation/Gait assistance: Min guard;Supervision Gait Distance (Feet): 300 Feet Assistive device: Rolling walker (2 wheeled) Gait Pattern/deviations: Step-through pattern Gait velocity: incr   General Gait Details: Initially min guard but pt progressed to supervision, cues for RW safety and to slow pacing during turns.     Stairs             Wheelchair Mobility    Modified Rankin (Stroke Patients Only)       Balance Overall balance assessment: No apparent balance deficits (not formally assessed)                                          Cognition Arousal/Alertness: Awake/alert Behavior During Therapy: WFL for tasks assessed/performed Overall Cognitive Status: Within Functional  Limits for tasks assessed                                        Exercises Total Joint Exercises Ankle Circles/Pumps: AROM;Both;20 reps;Supine Quad Sets: AROM;Right;10 reps;Supine Short Arc Quad: AROM;Right;10 reps;Supine Heel Slides: AROM;Right;10 reps;Supine Hip ABduction/ADduction: AROM;Right;10 reps;Supine Straight Leg Raises: AROM;Right;10 reps;Supine Long Arc Quad: AROM;Right;10 reps;Seated Goniometric ROM: 90 degrees flexion.     General Comments        Pertinent Vitals/Pain Pain Assessment: 0-10 Pain Score: 4  Pain Location: rt knee Pain Descriptors / Indicators: Aching Pain Intervention(s): Monitored during session;Repositioned    Home Living                      Prior Function            PT Goals (current goals can now be found in the care plan section) Acute Rehab PT Goals Patient Stated Goal: return home Potential to Achieve Goals: Good Progress towards PT goals: Progressing toward goals    Frequency    7X/week      PT Plan Current plan remains appropriate    Co-evaluation              AM-PAC PT "6 Clicks" Daily Activity  Outcome Measure  Difficulty turning over  in bed (including adjusting bedclothes, sheets and blankets)?: A Little Difficulty moving from lying on back to sitting on the side of the bed? : A Little Difficulty sitting down on and standing up from a chair with arms (e.g., wheelchair, bedside commode, etc,.)?: A Little Help needed moving to and from a bed to chair (including a wheelchair)?: A Little Help needed walking in hospital room?: A Little Help needed climbing 3-5 steps with a railing? : A Little 6 Click Score: 18    End of Session Equipment Utilized During Treatment: Gait belt Activity Tolerance: Patient tolerated treatment well Patient left: in chair;with call bell/phone within reach Nurse Communication: Mobility status PT Visit Diagnosis: Other abnormalities of gait and mobility  (R26.89);Pain Pain - Right/Left: Right Pain - part of body: Knee     Time: [id number] PT Time Calculation (min) (ACUTE ONLY): 23 min  Charges:  $Gait Training: 8-22 mins $Therapeutic Exercise: 8-22 mins                    G Codes:       Joycelyn RuaAimee Ciin Brazzel, PTA pager [phone]    Florestine AversAimee J Lovena Kluck 06/13/2018, 11:38 AM

## 2018-06-13 NOTE — Progress Notes (Signed)
Physical Therapy Treatment Patient Details Name: Craig Mann MRN: [id number] DOB: 1933-03-14 Today's Date: 06/13/2018    History of Present Illness Pt s/p rt TKR. PMH - lt TKR, lt THR, rt THR, rt rotator cuff repair x 2, htn, afib, macular degeneration    PT Comments    Pt performed gait training followed by stair training and review of HEP.  Plan for review of stair training next session to ensure patient understands sequencing.  Pt to d/c tomorrow per orthopedics.      Follow Up Recommendations  Follow surgeon's recommendation for DC plan and follow-up therapies     Equipment Recommendations  Rolling walker with 5" wheels;3in1 (PT)    Recommendations for Other Services       Precautions / Restrictions Precautions Precautions: Knee Restrictions Weight Bearing Restrictions: Yes RLE Weight Bearing: Weight bearing as tolerated    Mobility  Bed Mobility Overal bed mobility: Modified Independent             General bed mobility comments: Incr time  Transfers Overall transfer level: Needs assistance Equipment used: Rolling walker (2 wheeled) Transfers: Sit to/from Stand Sit to Stand: Min guard         General transfer comment: Asssist for safety and verbal cues for hand placement  Ambulation/Gait Ambulation/Gait assistance: Min guard;Supervision Gait Distance (Feet): 400 Feet Assistive device: Rolling walker (2 wheeled) Gait Pattern/deviations: Step-through pattern;Antalgic Gait velocity: incr   General Gait Details: Cues for RW safety remain.     Stairs Stairs: Yes Stairs assistance: Min assist Stair Management: One rail Left;Forwards Number of Stairs: 6 General stair comments: Cues for sequencing and L rail for hand placement.  Pt with difficulty during descent of stairs and presents with improper sequencing.  Plan next session for retrial to ensure technique.     Wheelchair Mobility    Modified Rankin (Stroke Patients Only)       Balance  Overall balance assessment: No apparent balance deficits (not formally assessed)                                          Cognition Arousal/Alertness: Awake/alert Behavior During Therapy: WFL for tasks assessed/performed Overall Cognitive Status: Within Functional Limits for tasks assessed                                        Exercises Total Joint Exercises Ankle Circles/Pumps: AROM;Both;20 reps;Supine Quad Sets: AROM;Right;10 reps;Supine Short Arc Quad: AROM;Right;10 reps;Supine Heel Slides: AROM;Right;10 reps;Supine Hip ABduction/ADduction: AROM;Right;10 reps;Supine Straight Leg Raises: AROM;Right;10 reps;Supine    General Comments        Pertinent Vitals/Pain Pain Assessment: 0-10 Pain Score: 4  Pain Location: rt knee Pain Descriptors / Indicators: Aching Pain Intervention(s): Monitored during session;Repositioned    Home Living                      Prior Function            PT Goals (current goals can now be found in the care plan section) Acute Rehab PT Goals Patient Stated Goal: return home Potential to Achieve Goals: Good Progress towards PT goals: Progressing toward goals    Frequency    7X/week      PT Plan Current plan remains appropriate    Co-evaluation  AM-PAC PT "6 Clicks" Daily Activity  Outcome Measure  Difficulty turning over in bed (including adjusting bedclothes, sheets and blankets)?: A Little Difficulty moving from lying on back to sitting on the side of the bed? : A Little Difficulty sitting down on and standing up from a chair with arms (e.g., wheelchair, bedside commode, etc,.)?: A Little Help needed moving to and from a bed to chair (including a wheelchair)?: A Little Help needed walking in hospital room?: A Little Help needed climbing 3-5 steps with a railing? : A Little 6 Click Score: 18    End of Session Equipment Utilized During Treatment: Gait belt Activity  Tolerance: Patient tolerated treatment well Patient left: in chair;with call bell/phone within reach Nurse Communication: Mobility status PT Visit Diagnosis: Other abnormalities of gait and mobility (R26.89);Pain Pain - Right/Left: Right Pain - part of body: Knee     Time: [id number] PT Time Calculation (min) (ACUTE ONLY): 23 min  Charges:  $Gait Training: 8-22 mins $Therapeutic Exercise: 8-22 mins                    G Codes:       Joycelyn RuaAimee Elvera Almario, PTA pager [phone]    Florestine Aversimee J Moshe Wenger 06/13/2018, 4:34 PM

## 2018-06-14 ENCOUNTER — Encounter (HOSPITAL_COMMUNITY): Payer: Self-pay | Admitting: General Practice

## 2018-06-14 NOTE — Progress Notes (Addendum)
Subjective: 2 Days Post-Op Procedure(s) (LRB): RIGHT TOTAL KNEE ARTHROPLASTY (Right) Patient reports pain as mild.  Feeling great this am.  Objective: Vital signs in last 24 hours: Temp:  [97.6 F (36.4 C)-97.8 F (36.6 C)] 97.8 F (36.6 C) (07/02 2033) Pulse Rate:  [57-67] 67 (07/02 2033) Resp:  [16] 16 (07/02 2033) BP: (105-131)/(69-79) 131/79 (07/02 2033) SpO2:  [98 %-99 %] 99 % (07/02 2033)  Intake/Output from previous day: 07/02 0701 - 07/03 0700 In: 1320 [P.O.:1320] Out: 850 [Urine:850] Intake/Output this shift: No intake/output data recorded.  Recent Labs    06/13/18 0349  HGB 12.6*   Recent Labs    06/13/18 0349  WBC 11.5*  RBC 4.16*  HCT 38.2*  PLT 129*   Recent Labs    06/13/18 0349  NA 135  K 4.3  CL 104  CO2 24  BUN 33*  CREATININE 1.92*  GLUCOSE 168*  CALCIUM 8.6*   No results for input(s): LABPT, INR in the last 72 hours.  Neurologically intact Neurovascular intact Sensation intact distally Intact pulses distally Dorsiflexion/Plantar flexion intact Incision: dressing C/D/I No cellulitis present Compartment soft  Anticipated LOS equal to or greater than 2 midnights due to - Age 82 and older with one or more of the following:  - Obesity  - Expected need for hospital services (PT, OT, Nursing) required for safe  discharge  - Anticipated need for postoperative skilled nursing care or inpatient rehab  - Active co-morbidities: a-fibb OR   - Unanticipated findings during/Post Surgery: Slow post-op progression: GI, pain control, mobility  - Patient is a high risk of re-admission due to: None   Assessment/Plan: 2 Days Post-Op Procedure(s) (LRB): RIGHT TOTAL KNEE ARTHROPLASTY (Right) Advance diet Up with therapy D/C IV fluids  WBAT RLE D/C home with hhpt after first session of PT PLEASE PLACE TED HOSE TO RLE PRIOR TO D/C Bandage changed by me today    Cristie HemMary L Stanbery 06/14/2018, 8:09 AM

## 2018-06-14 NOTE — Progress Notes (Signed)
Physical Therapy Treatment Patient Details Name: Craig Mann MRN: [id number] DOB: 1933-02-03 Today's Date: 06/14/2018    History of Present Illness Pt s/p rt TKR. PMH - lt TKR, lt THR, rt THR, rt rotator cuff repair x 2, htn, afib, macular degeneration    PT Comments    Pt performed gait training, review of stair training and review of supine HEP.  Pt to d/c home this pm.  Will f/u in pm if patient remains hospitalized.  Pt is ready from a mobility stand point to d/c home.   Follow Up Recommendations  Follow surgeon's recommendation for DC plan and follow-up therapies     Equipment Recommendations  Rolling walker with 5" wheels;3in1 (PT)    Recommendations for Other Services       Precautions / Restrictions Precautions Precautions: Knee Restrictions Weight Bearing Restrictions: Yes RLE Weight Bearing: Weight bearing as tolerated    Mobility  Bed Mobility Overal bed mobility: Modified Independent             General bed mobility comments: Incr time  Transfers Overall transfer level: Needs assistance Equipment used: Rolling walker (2 wheeled) Transfers: Sit to/from Stand Sit to Stand: Min assist         General transfer comment: Pt required min assistance from low seated surface, used rocking momentum to achieve standing.    Ambulation/Gait Ambulation/Gait assistance: Min guard;Supervision Gait Distance (Feet): 500 Feet   Gait Pattern/deviations: Step-through pattern;Trunk flexed;Antalgic Gait velocity: incr   General Gait Details: Remains to require cues for RW safety, forward gaze and weight shifting to improve gait symmetry.     Stairs Stairs: Yes Stairs assistance: Min assist;Supervision Stair Management: One rail Left;Forwards Number of Stairs: 7 General stair comments: Pt able to recall and verbalize sequencing.     Wheelchair Mobility    Modified Rankin (Stroke Patients Only)       Balance Overall balance assessment: No apparent  balance deficits (not formally assessed)                                          Cognition Arousal/Alertness: Awake/alert Behavior During Therapy: WFL for tasks assessed/performed Overall Cognitive Status: Within Functional Limits for tasks assessed                                        Exercises Total Joint Exercises Ankle Circles/Pumps: AROM;Both;20 reps;Supine Quad Sets: AROM;Right;10 reps;Supine Towel Squeeze: AROM;Both;10 reps;Supine Short Arc Quad: AROM;Right;10 reps;Supine Heel Slides: AROM;Right;10 reps;Supine Hip ABduction/ADduction: AROM;Right;10 reps;Supine Straight Leg Raises: AROM;Right;10 reps;Supine Goniometric ROM: 94 degrees flexion in R knee.      General Comments        Pertinent Vitals/Pain Pain Assessment: 0-10 Pain Score: 4  Pain Location: rt knee Pain Descriptors / Indicators: Aching Pain Intervention(s): Monitored during session;Repositioned;Ice applied;Patient requesting pain meds-RN notified    Home Living                      Prior Function            PT Goals (current goals can now be found in the care plan section) Acute Rehab PT Goals Patient Stated Goal: return home Potential to Achieve Goals: Good Progress towards PT goals: Progressing toward goals    Frequency    7X/week  PT Plan Current plan remains appropriate    Co-evaluation              AM-PAC PT "6 Clicks" Daily Activity  Outcome Measure  Difficulty turning over in bed (including adjusting bedclothes, sheets and blankets)?: A Little Difficulty moving from lying on back to sitting on the side of the bed? : A Little Difficulty sitting down on and standing up from a chair with arms (e.g., wheelchair, bedside commode, etc,.)?: A Little Help needed moving to and from a bed to chair (including a wheelchair)?: A Little Help needed walking in hospital room?: A Little Help needed climbing 3-5 steps with a railing? : A  Little 6 Click Score: 18    End of Session Equipment Utilized During Treatment: Gait belt Activity Tolerance: Patient tolerated treatment well Patient left: in chair;with call bell/phone within reach Nurse Communication: Mobility status PT Visit Diagnosis: Other abnormalities of gait and mobility (R26.89);Pain Pain - Right/Left: Right Pain - part of body: Knee     Time: [id number] PT Time Calculation (min) (ACUTE ONLY): 22 min  Charges:  $Gait Training: 8-22 mins                    G Codes:       Joycelyn RuaAimee Lataisha Colan, PTA pager [phone]    Florestine AversAimee J Messiah Ahr 06/14/2018, 10:46 AM

## 2018-06-14 NOTE — Care Management Note (Signed)
Case Management Note  Patient Details  Name: Craig Mann MRN: [id number] Date of Birth: [birth year]  Subjective/Objective:  84 yr old gentleman s/p right total knee arthroplasty.                  Action/Plan:  Case manager spoke with patient concerning discharge plan and DME. Patient was preoperatively setup with [facility], no changes. He will have support at discharge.   Expected Discharge Date:  06/14/18               Expected Discharge Plan:  Home w Home Health Services  In-House Referral:  NA  Discharge planning Services  CM Consult  Post Acute Care Choice:  Durable Medical Equipment, Home Health Choice offered to:  Patient  DME Arranged:  3-N-1, Walker rolling DME Agency:  [facility]  HH Arranged:  PT HH Agency:  [facility]  Status of Service:  Completed, signed off  If discussed at [employer] Meetings, dates discussed:    Additional Comments:  Durenda GuthrieBrady, Kemari Mares Naomi, RN 06/14/2018, 12:19 PM

## 2018-06-14 NOTE — Discharge Summary (Signed)
Patient ID: Craig Mann MRN: [id number] DOB/AGE: 1933-03-04 84 y.o.  Admit date: 06/12/2018 Discharge date: 06/14/2018  Admission Diagnoses:  Active Problems:   Osteoarthritis of right knee   Total knee replacement status   Discharge Diagnoses:  Same  Past Medical History:  Diagnosis Date  . A-fib (HCC)   . Arthritis   . Hypertension   . Macular degeneration of left eye   . Primary localized osteoarthritis of right knee     Surgeries: Procedure(s): RIGHT TOTAL KNEE ARTHROPLASTY on 06/12/2018   Consultants:   Discharged Condition: Improved  Hospital Course: Craig Mann is an 84 y.o. male who was admitted 06/12/2018 for operative treatment of primary localized osteoarthritis right knee. Patient has severe unremitting pain that affects sleep, daily activities, and work/hobbies. After pre-op clearance the patient was taken to the operating room on 06/12/2018 and underwent  Procedure(s): RIGHT TOTAL KNEE ARTHROPLASTY.    Patient was given perioperative antibiotics:  Anti-infectives (From admission, onward)   Start     Dose/Rate Route Frequency Ordered Stop   06/12/18 1330  ceFAZolin (ANCEF) IVPB 2g/100 mL premix     2 g 200 mL/hr over 30 Minutes Intravenous Every 6 hours 06/12/18 1126 06/13/18 0115   06/12/18 0804  vancomycin (VANCOCIN) powder  Status:  Discontinued       As needed 06/12/18 0805 06/12/18 0950   06/12/18 0600  ceFAZolin (ANCEF) IVPB 2g/100 mL premix     2 g 200 mL/hr over 30 Minutes Intravenous On call to O.R. 06/12/18 4098 06/12/18 0757   06/12/18 0550  ceFAZolin (ANCEF) 2-4 GM/100ML-% IVPB    Note to Pharmacy:  Ray Church   : cabinet override      06/12/18 0550 06/12/18 0757       Patient was given sequential compression devices, early ambulation, and chemoprophylaxis to prevent DVT.  Patient benefited maximally from hospital stay and there were no complications.    Recent vital signs:  Patient Vitals for the past 24 hrs:  BP Temp Temp src  Pulse Resp SpO2  06/13/18 2033 131/79 97.8 F (36.6 C) Oral 67 16 99 %  06/13/18 1620 131/76 - - - - -  06/13/18 1338 131/76 97.6 F (36.4 C) Oral 65 16 98 %  06/13/18 0841 105/69 - - (!) 57 - -     Recent laboratory studies:  Recent Labs    06/13/18 0349  WBC 11.5*  HGB 12.6*  HCT 38.2*  PLT 129*  NA 135  K 4.3  CL 104  CO2 24  BUN 33*  CREATININE 1.92*  GLUCOSE 168*  CALCIUM 8.6*     Discharge Medications:   Allergies as of 06/14/2018   No Known Allergies     Medication List    STOP taking these medications   TYLENOL 8 HOUR ARTHRITIS PAIN 650 MG CR tablet Generic drug:  acetaminophen     TAKE these medications   amLODipine 5 MG tablet Commonly known as:  NORVASC Take 5 mg by mouth daily with supper.   aspirin EC 81 MG tablet Take 1 tablet (81 mg total) by mouth 2 (two) times daily.   methocarbamol 500 MG tablet Commonly known as:  ROBAXIN Take 1 tablet (500 mg total) by mouth every 6 (six) hours as needed for muscle spasms.   metoprolol tartrate 50 MG tablet Commonly known as:  LOPRESSOR Take 50 mg by mouth 2 (two) times daily. With breakfast & supper   olmesartan-hydrochlorothiazide 40-25 MG tablet Commonly known as:  BENICAR HCT Take 1 tablet by mouth daily with breakfast.   ondansetron 4 MG tablet Commonly known as:  ZOFRAN Take 1-2 tablets (4-8 mg total) by mouth every 8 (eight) hours as needed for nausea or vomiting.   oxyCODONE 10 mg 12 hr tablet Commonly known as:  OXYCONTIN Take 1 tablet (10 mg total) by mouth every 12 (twelve) hours for 3 days.   oxyCODONE 5 MG immediate release tablet Commonly known as:  Oxy IR/ROXICODONE Take 1-3 tablets (5-15 mg total) by mouth every 4 (four) hours as needed.   PRESERVISION AREDS 2 PO Take 1 tablet by mouth 2 (two) times daily.   OCUVITE-LUTEIN PO Take 1 tablet by mouth 2 (two) times daily.   promethazine 25 MG tablet Commonly known as:  PHENERGAN Take 1 tablet (25 mg total) by mouth every 6  (six) hours as needed for nausea.   senna-docusate 8.6-50 MG tablet Commonly known as:  SENOKOT S Take 1 tablet by mouth at bedtime as needed.   SYSTANE 0.4-0.3 % Soln Generic drug:  Polyethyl Glycol-Propyl Glycol Place 1 drop into both eyes 2 (two) times daily. SYSTANE            Durable Medical Equipment  (From admission, onward)        Start     Ordered   06/12/18 1127  DME Walker rolling  Once    Question:  Patient needs a walker to treat with the following condition  Answer:  Total knee replacement status   06/12/18 1126   06/12/18 1127  DME 3 n 1  Once     06/12/18 1126   06/12/18 1127  DME Bedside commode  Once    Question:  Patient needs a bedside commode to treat with the following condition  Answer:  Total knee replacement status   06/12/18 1126      Diagnostic Studies: Dg Chest 2 View  Result Date: 06/01/2018 CLINICAL DATA:  Pre-admission evaluation for knee replacement EXAM: CHEST - 2 VIEW COMPARISON:  11/29/2015 FINDINGS: Cardiac shadow is within normal limits. Left atrial enlargement is noted. The lungs are well aerated bilaterally. No focal infiltrate or sizable effusion is seen. Degenerative changes of the thoracic spine are noted. IMPRESSION: No active cardiopulmonary disease. Electronically Signed   By: Alcide CleverMark  Lukens M.D.   On: 06/01/2018 13:24   Dg Knee Right Port  Result Date: 06/12/2018 CLINICAL DATA:  Postop right knee replacement EXAM: PORTABLE RIGHT KNEE - 1-2 VIEW COMPARISON:  None. FINDINGS: The right knee demonstrates a total knee arthroplasty without evidence of hardware failure complication. There is no significant joint effusion. There is no fracture or dislocation. The alignment is anatomic. Post-surgical changes noted in the surrounding soft tissues. Peripheral vascular atherosclerotic disease. IMPRESSION: Interval right total knee arthroplasty. Electronically Signed   By: Elige KoHetal  Patel   On: 06/12/2018 10:20    Disposition: Discharge disposition:  01-Home or Self Care         Follow-up Information    Tarry KosXu, Naiping M, MD In 2 weeks.   Specialty:  Orthopedic Surgery Why:  For suture removal, For wound re-check Contact information: [address] [place] [address] [phone]            Signed: Cristie HemMary L Terrika Zuver 06/14/2018, 8:10 AM

## 2018-06-16 ENCOUNTER — Telehealth (INDEPENDENT_AMBULATORY_CARE_PROVIDER_SITE_OTHER): Payer: Self-pay | Admitting: Orthopaedic Surgery

## 2018-06-16 NOTE — Telephone Encounter (Signed)
Please advise 

## 2018-06-16 NOTE — Telephone Encounter (Signed)
Craig Mann  [phone] Verbal ok if possible to see pt today     Verbal orders  Twice a week for one week including today if [name] receives verbal ok   Three times for one week     Verbal order for following week if orders are not improved by today    One week one   Three week one   One week one

## 2018-06-18 NOTE — Telephone Encounter (Signed)
yes

## 2018-06-19 NOTE — Telephone Encounter (Signed)
LMOM for Craig Mann, approved

## 2018-06-23 ENCOUNTER — Ambulatory Visit (INDEPENDENT_AMBULATORY_CARE_PROVIDER_SITE_OTHER): Payer: Medicare Other | Admitting: Orthopaedic Surgery

## 2018-06-23 ENCOUNTER — Encounter (INDEPENDENT_AMBULATORY_CARE_PROVIDER_SITE_OTHER): Payer: Self-pay | Admitting: Orthopaedic Surgery

## 2018-06-23 DIAGNOSIS — Z96651 Presence of right artificial knee joint: Secondary | ICD-10-CM

## 2018-06-23 MED ORDER — "XEROFORM OIL EMULSION 2""X2"" EX PADS": MEDICATED_PAD | CUTANEOUS | 1 refills | Status: DC

## 2018-06-23 NOTE — Progress Notes (Signed)
Post-Op Visit Note   Patient: Craig Mann           Date of Birth: 15-Feb-1933           MRN: [id number] Visit Date: 06/23/2018 PCP: Rodrigo Ran, MD   Assessment & Plan:  Chief Complaint:  Chief Complaint  Patient presents with  . Right Knee - Routine Post Op    06/12/18 right total knee arthroplasty   Visit Diagnoses:  1. S/P TKR (total knee replacement), right     Plan: Patient is a pleasant 84 year old gentleman who presents to our clinic today 11 days status post right total knee arthroplasty, date of surgery 06/12/2018.  He has been working well with home health physical therapy making great progress.  Minimal pain.  He states he has been elevating his leg and wearing compression stockings on a daily basis.  No fevers, chills or any other systemic symptoms.  Examination of his right knee reveals a well-healing surgical incision without evidence of cellulitis or infection.  He does have marked swelling to the right lower extremity without calf pain or tenderness.  He has 2 grape size blisters on that has unroofed to the medial aspect of the right ankle.  He thinks this is from the swollen foot rubbing against his slipper.  At this point, we will transition the patient to outpatient physical therapy.  A prescription was written today and given to the patient.  We will also apply Xeroform to the blisters today.  We will give him a prescription for this as well.  He will do this twice daily for the next 7 to 10 days or until the blisters are healed.  He will continue wearing compression stockings and elevating for swelling.  He will follow-up with [place] in 4 weeks time for recheck.  Call with concerns or questions in the meantime.  Follow-Up Instructions: Return in about 1 month (around 07/21/2018).   Orders:  No orders of the defined types were placed in this encounter.  Meds ordered this encounter  Medications  . DISCONTD: Bismuth Tribromoph-Petrolatum (XEROFORM OIL EMULSION 2"X2")  PADS    Sig: Apply twice daily x 7 days or until healed    Dispense:  10 each    Refill:  1  . Bismuth Tribromoph-Petrolatum (XEROFORM OIL EMULSION 2"X2") PADS    Sig: Apply twice daily x 7 days or until healed    Dispense:  10 each    Refill:  1    Imaging: No results found.  PMFS History: Patient Active Problem List   Diagnosis Date Noted  . Osteoarthritis of right knee 06/12/2018  . S/P TKR (total knee replacement), right 06/12/2018  . Persistent atrial fibrillation (HCC) 11/12/2015  . Essential hypertension 11/12/2015   Past Medical History:  Diagnosis Date  . A-fib (HCC)   . Arthritis   . Hypertension   . Macular degeneration of left eye   . Primary localized osteoarthritis of right knee     Family History  Problem Relation Age of Onset  . Kidney failure Brother     Past Surgical History:  Procedure Laterality Date  . ATRIAL FIBRILLATION ABLATION  03/2016  . FOOT ARTHRODESIS, MODIFIED MCBRIDE Right 05/1983  . HIP SURGERY Left 04/1999 AND 10/1994   REPLACEMENT X2  . INGUINAL HERNIA REPAIR Left 07/2012   DOUBLE HERNIA "on the left side"  . INGUINAL HERNIA REPAIR Right 05/1997  . JOINT REPLACEMENT    . KNEE CARTILAGE SURGERY Left 04/1998  CARTILAGE  . NOSE SURGERY  1959  . ROTATOR CUFF REPAIR Right 07/2002   SHOULDER  . TOTAL KNEE ARTHROPLASTY Left 10/1999 and 1969   TIMES [id number]/1963,1962  . TOTAL KNEE ARTHROPLASTY Right 06/12/2018   Procedure: RIGHT TOTAL KNEE ARTHROPLASTY;  Surgeon: Tarry KosXu, Naiping M, MD;  Location: [facility] OR;  Service: Orthopedics;  Laterality: Right;   Social History   Occupational History  . Not on file  Tobacco Use  . Smoking status: Former Games developermoker  . Smokeless tobacco: Former Engineer, waterUser  Substance and Sexual Activity  . Alcohol use: Yes    Alcohol/week: 0.0 oz    Comment: ONE GLASS OF WINE PER WEEK   . Drug use: No  . Sexual activity: Not on file

## 2018-06-26 ENCOUNTER — Other Ambulatory Visit (INDEPENDENT_AMBULATORY_CARE_PROVIDER_SITE_OTHER): Payer: Self-pay

## 2018-06-26 ENCOUNTER — Telehealth (INDEPENDENT_AMBULATORY_CARE_PROVIDER_SITE_OTHER): Payer: Self-pay | Admitting: Orthopaedic Surgery

## 2018-06-26 MED ORDER — OXYCODONE-ACETAMINOPHEN 5-325 MG PO TABS: ORAL_TABLET | ORAL | 0 refills | Status: DC

## 2018-06-26 NOTE — Telephone Encounter (Signed)
[facility]   Med refill  Oxycodone

## 2018-06-26 NOTE — Telephone Encounter (Signed)
Pt is calling needs a pain  medication refill. Wants to come this afternoon

## 2018-06-26 NOTE — Telephone Encounter (Signed)
Rx ready for pick up. 

## 2018-06-26 NOTE — Telephone Encounter (Signed)
Yes we can fill percocet 5/325 #40 zero one  - two q 4-6 hrs prn pain

## 2018-06-26 NOTE — Telephone Encounter (Signed)
Spoke with patient he advised his brother Janeann MerlJess Coon will pick up his Rx today. Patient gave verbal for Rx pickup. Patient's phone number [phone]

## 2018-06-26 NOTE — Telephone Encounter (Signed)
Can you advise for Dr Roda ShuttersXu, since he is out of the office today (in surgery). Thank you.   S/P RIGHT TKA 06/12/18-DR XU

## 2018-07-21 ENCOUNTER — Ambulatory Visit (INDEPENDENT_AMBULATORY_CARE_PROVIDER_SITE_OTHER): Payer: Medicare Other | Admitting: Orthopaedic Surgery

## 2018-07-21 ENCOUNTER — Ambulatory Visit (INDEPENDENT_AMBULATORY_CARE_PROVIDER_SITE_OTHER): Payer: Medicare Other

## 2018-07-21 ENCOUNTER — Encounter (INDEPENDENT_AMBULATORY_CARE_PROVIDER_SITE_OTHER): Payer: Self-pay | Admitting: Orthopaedic Surgery

## 2018-07-21 DIAGNOSIS — Z96651 Presence of right artificial knee joint: Secondary | ICD-10-CM | POA: Diagnosis not present

## 2018-07-21 NOTE — Progress Notes (Signed)
   Post-Op Visit Note   Patient: Craig EchevariaMichael O Banghart           Date of Birth: [birth year]           MRN: [id number] Visit Date: 07/21/2018 PCP: Rodrigo RanPerini, Mark, MD   Assessment & Plan:  Chief Complaint:  Chief Complaint  Patient presents with  . Right Knee - Routine Post Op   Visit Diagnoses:  1. Total knee replacement status, right     Plan: Patient is 6 weeks status post right total knee replacement.  Overall he is improving and feeling better.  He is progressing well with physical therapy.  His surgical scar is healed.  His range of motion is progressing and improving very well.  Overall he is ambulating with a cane and he is satisfied.  He will continue with home exercises.  At this point we can discontinue DVT prophylaxis and TED hose.  Increase activity as tolerated.  Recheck in 6 weeks.  Follow-Up Instructions: Return in about 6 weeks (around 09/01/2018).   Orders:  Orders Placed This Encounter  Procedures  . XR KNEE 3 VIEW RIGHT   No orders of the defined types were placed in this encounter.   Imaging: Xr Knee 3 View Right  Result Date: 07/21/2018 Stable total knee replacement.   PMFS History: Patient Active Problem List   Diagnosis Date Noted  . Osteoarthritis of right knee 06/12/2018  . S/P TKR (total knee replacement), right 06/12/2018  . Persistent atrial fibrillation (HCC) 11/12/2015  . Essential hypertension 11/12/2015   Past Medical History:  Diagnosis Date  . A-fib (HCC)   . Arthritis   . Hypertension   . Macular degeneration of left eye   . Primary localized osteoarthritis of right knee     Family History  Problem Relation Age of Onset  . Kidney failure Brother     Past Surgical History:  Procedure Laterality Date  . ATRIAL FIBRILLATION ABLATION  03/2016  . FOOT ARTHRODESIS, MODIFIED MCBRIDE Right 05/1983  . HIP SURGERY Left 04/1999 AND 10/1994   REPLACEMENT X2  . INGUINAL HERNIA REPAIR Left 07/2012   DOUBLE HERNIA "on the left side"  . INGUINAL  HERNIA REPAIR Right 05/1997  . JOINT REPLACEMENT    . KNEE CARTILAGE SURGERY Left 04/1998   CARTILAGE  . NOSE SURGERY  1959  . ROTATOR CUFF REPAIR Right 07/2002   SHOULDER  . TOTAL KNEE ARTHROPLASTY Left 10/1999 and 1969   TIMES [id number]/1963,1962  . TOTAL KNEE ARTHROPLASTY Right 06/12/2018   Procedure: RIGHT TOTAL KNEE ARTHROPLASTY;  Surgeon: Tarry KosXu, Sosaia Pittinger M, MD;  Location: [facility] OR;  Service: Orthopedics;  Laterality: Right;   Social History   Occupational History  . Not on file  Tobacco Use  . Smoking status: Former Games developermoker  . Smokeless tobacco: Former Engineer, waterUser  Substance and Sexual Activity  . Alcohol use: Yes    Alcohol/week: 0.0 standard drinks    Comment: ONE GLASS OF WINE PER WEEK   . Drug use: No  . Sexual activity: Not on file

## 2018-07-28 ENCOUNTER — Ambulatory Visit (INDEPENDENT_AMBULATORY_CARE_PROVIDER_SITE_OTHER): Payer: Medicare Other | Admitting: Podiatry

## 2018-07-28 ENCOUNTER — Encounter: Payer: Self-pay | Admitting: Podiatry

## 2018-07-28 DIAGNOSIS — M79674 Pain in right toe(s): Secondary | ICD-10-CM

## 2018-07-28 DIAGNOSIS — L03031 Cellulitis of right toe: Secondary | ICD-10-CM | POA: Diagnosis not present

## 2018-07-28 DIAGNOSIS — L02611 Cutaneous abscess of right foot: Secondary | ICD-10-CM

## 2018-07-28 DIAGNOSIS — L600 Ingrowing nail: Secondary | ICD-10-CM

## 2018-07-28 DIAGNOSIS — B351 Tinea unguium: Secondary | ICD-10-CM | POA: Diagnosis not present

## 2018-07-28 DIAGNOSIS — M79675 Pain in left toe(s): Secondary | ICD-10-CM

## 2018-07-28 MED ORDER — CEPHALEXIN 500 MG PO CAPS: 500.0000 mg | ORAL_CAPSULE | Freq: Three times a day (TID) | ORAL | 0 refills | Status: DC

## 2018-07-28 NOTE — Patient Instructions (Signed)

## 2018-08-01 NOTE — Progress Notes (Signed)
Subjective:   Patient ID: Craig Mann, male   DOB: 84 y.o.   MRN: [id number]   HPI 84 year old male presents the office for concerns of thick, painful, elongated toenails that he cannot trim himself.  He is in [place] currently as he recently underwent knee surgery, knee replacement on the right side and he is here for recovering from his back to [place].  States that while in [place] he has a podiatrist who trims his nails regularly however he is not been able to make it to the appointment recently needs to have his nails trimmed.  He also states that he has noticed his right big toe becoming somewhat red but denies any drainage or pus but it is tender.   Review of Systems  All other systems reviewed and are negative.  Past Medical History:  Diagnosis Date  . A-fib (HCC)   . Arthritis   . Hypertension   . Macular degeneration of left eye   . Primary localized osteoarthritis of right knee     Past Surgical History:  Procedure Laterality Date  . ATRIAL FIBRILLATION ABLATION  03/2016  . FOOT ARTHRODESIS, MODIFIED MCBRIDE Right 05/1983  . HIP SURGERY Left 04/1999 AND 10/1994   REPLACEMENT X2  . INGUINAL HERNIA REPAIR Left 07/2012   DOUBLE HERNIA "on the left side"  . INGUINAL HERNIA REPAIR Right 05/1997  . JOINT REPLACEMENT    . KNEE CARTILAGE SURGERY Left 04/1998   CARTILAGE  . NOSE SURGERY  1959  . ROTATOR CUFF REPAIR Right 07/2002   SHOULDER  . TOTAL KNEE ARTHROPLASTY Left 10/1999 and 1969   TIMES [id number]/1963,1962  . TOTAL KNEE ARTHROPLASTY Right 06/12/2018   Procedure: RIGHT TOTAL KNEE ARTHROPLASTY;  Surgeon: Tarry KosXu, Naiping M, MD;  Location: [facility] OR;  Service: Orthopedics;  Laterality: Right;     Current Outpatient Medications:  .  amLODipine (NORVASC) 5 MG tablet, Take 5 mg by mouth daily with supper., Disp: , Rfl:  .  aspirin EC 81 MG tablet, Take 1 tablet (81 mg total) by mouth 2 (two) times daily., Disp: 84 tablet, Rfl: 0 .  Bismuth Tribromoph-Petrolatum (XEROFORM OIL EMULSION  2"X2") PADS, Apply twice daily x 7 days or until healed, Disp: 10 each, Rfl: 1 .  cephALEXin (KEFLEX) 500 MG capsule, Take 1 capsule (500 mg total) by mouth 3 (three) times daily., Disp: 30 capsule, Rfl: 0 .  methocarbamol (ROBAXIN) 500 MG tablet, Take 1 tablet (500 mg total) by mouth every 6 (six) hours as needed for muscle spasms., Disp: 30 tablet, Rfl: 2 .  metoprolol tartrate (LOPRESSOR) 50 MG tablet, Take 50 mg by mouth 2 (two) times daily. With breakfast & supper, Disp: , Rfl:  .  Multiple Vitamins-Minerals (OCUVITE-LUTEIN PO), Take 1 tablet by mouth 2 (two) times daily., Disp: , Rfl:  .  Multiple Vitamins-Minerals (PRESERVISION AREDS 2 PO), Take 1 tablet by mouth 2 (two) times daily., Disp: , Rfl:  .  olmesartan-hydrochlorothiazide (BENICAR HCT) 40-25 MG tablet, Take 1 tablet by mouth daily with breakfast., Disp: , Rfl: 12 .  ondansetron (ZOFRAN) 4 MG tablet, Take 1-2 tablets (4-8 mg total) by mouth every 8 (eight) hours as needed for nausea or vomiting., Disp: 40 tablet, Rfl: 0 .  oxyCODONE (OXY IR/ROXICODONE) 5 MG immediate release tablet, Take 1-3 tablets (5-15 mg total) by mouth every 4 (four) hours as needed., Disp: 30 tablet, Rfl: 0 .  oxyCODONE-acetaminophen (PERCOCET/ROXICET) 5-325 MG tablet, TAKE 1-2 TABS  Q4-6 hrs prn pain, Disp: 30 tablet,  Rfl: 0 .  Polyethyl Glycol-Propyl Glycol (SYSTANE) 0.4-0.3 % SOLN, Place 1 drop into both eyes 2 (two) times daily. SYSTANE, Disp: , Rfl:  .  promethazine (PHENERGAN) 25 MG tablet, Take 1 tablet (25 mg total) by mouth every 6 (six) hours as needed for nausea., Disp: 30 tablet, Rfl: 1 .  senna-docusate (SENOKOT S) 8.6-50 MG tablet, Take 1 tablet by mouth at bedtime as needed., Disp: 30 tablet, Rfl: 1  No Known Allergies      Objective:  Physical Exam  General: AAO x3, NAD  Dermatological: Nails are hypertrophic, dystrophic, brittle, discolored, elongated 10.  Particularly the right lateral hallux toenail nail is painful towards the tip of  the toe and there is localized edema and erythema to the lateral portion of the right hallux there is no drainage or pus there is no ascending cellulitis.  There is no fluctuation or crepitation.  There is no surrounding redness or drainage to the other nails. Tenderness nails 1-5 bilaterally. No open lesions or pre-ulcerative lesions are identified today.  Vascular: Dorsalis Pedis artery and Posterior Tibial artery pedal pulses are 2/4 bilateral with immedate capillary fill time. There is no pain with calf compression, swelling, warmth, erythema.   Neruologic: Grossly intact via light touch bilateral.  Protective threshold with Semmes Wienstein monofilament intact to all pedal sites bilateral.   Musculoskeletal: No gross boney pedal deformities bilateral. No pain, crepitus, or limitation noted with foot and ankle range of motion bilateral. Muscular strength 5/5 in all groups tested bilateral.     Assessment:   Onychomycosis with ingrown toenails localized infection right hallux     Plan:  -Treatment options discussed including all alternatives, risks, and complications -Etiology of symptoms were discussed -Nails debrided 10 without any complications or bleeding.  On the right lateral hallux were performed a slant back procedure to remove the symptomatic portion of ingrown toenail.  Start antibiotics given his recent knee replacement.  Keflex prescribed.  Epson salt soaks.  Recommend antibiotic ointment and a bandage daily. -Daily foot inspection -Follow-up in 1 week for nail check or sooner if any problems arise. In the meantime, encouraged to call the office with any questions, concerns, change in symptoms.   Ovid CurdMatthew Wagoner, DPM

## 2018-08-10 ENCOUNTER — Encounter: Payer: Self-pay | Admitting: Podiatry

## 2018-08-10 ENCOUNTER — Ambulatory Visit (INDEPENDENT_AMBULATORY_CARE_PROVIDER_SITE_OTHER): Payer: Medicare Other | Admitting: Podiatry

## 2018-08-10 DIAGNOSIS — L600 Ingrowing nail: Secondary | ICD-10-CM

## 2018-08-11 NOTE — Progress Notes (Signed)
Subjective: 84 year old male presents the office today for follow-up evaluation of infection of the right hallux toenail.  He has been soaking in Epsom salts given antibiotic ointment and a bandage on the area daily he has been taking the antibiotic as directed.  He states the area has resolved and there is no longer any redness or drainage or any pain or swelling.  He has no other concerns today. Denies any systemic complaints such as fevers, chills, nausea, vomiting. No acute changes since last appointment, and no other complaints at this time.   Objective: AAO x3, NAD DP/PT pulses palpable bilaterally, CRT less than 3 seconds On evaluation of the right hallux toenail there is no edema, erythema, drainage or pus there is no pain to the nail there is no signs of infection and the area appears to be resolved. no open lesions or pre-ulcerative lesions.  No pain with calf compression, swelling, warmth, erythema  Assessment: Resolved infection right hallux  Plan: -All treatment options discussed with the patient including all alternatives, risks, complications.  -I did further debridement nail today without any complications or bleeding.  The infection is resolved.  I would still continue soaking Epsom salts the next couple of days and keep antibiotic ointment and a bandage on the area.  Monitoring signs or symptoms of recurrence. -Patient encouraged to call the office with any questions, concerns, change in symptoms.   Vivi BarrackMatthew R Mann DPM

## 2018-09-01 ENCOUNTER — Encounter (INDEPENDENT_AMBULATORY_CARE_PROVIDER_SITE_OTHER): Payer: Self-pay | Admitting: Orthopaedic Surgery

## 2018-09-01 ENCOUNTER — Ambulatory Visit (INDEPENDENT_AMBULATORY_CARE_PROVIDER_SITE_OTHER): Payer: Medicare Other | Admitting: Orthopaedic Surgery

## 2018-09-01 DIAGNOSIS — M65332 Trigger finger, left middle finger: Secondary | ICD-10-CM

## 2018-09-01 DIAGNOSIS — Z96651 Presence of right artificial knee joint: Secondary | ICD-10-CM | POA: Diagnosis not present

## 2018-09-01 MED ORDER — BUPIVACAINE HCL 0.5 % IJ SOLN
0.3300 mL | INTRAMUSCULAR | Status: AC | PRN
  Administered 2018-09-01: .33 mL

## 2018-09-01 MED ORDER — LIDOCAINE HCL 1 % IJ SOLN
0.3000 mL | INTRAMUSCULAR | Status: AC | PRN
  Administered 2018-09-01: .3 mL

## 2018-09-01 MED ORDER — METHYLPREDNISOLONE ACETATE 40 MG/ML IJ SUSP
13.3300 mg | INTRAMUSCULAR | Status: AC | PRN
  Administered 2018-09-01: 13.33 mg

## 2018-09-01 NOTE — Progress Notes (Signed)
Office Visit Note   Patient: Craig EchevariaMichael O Mann           Date of Birth: 14-Apr-1933           MRN: [id number] Visit Date: 09/01/2018              Requested by: Rodrigo RanPerini, Mark, MD [address] [place], [place] [id number] PCP: Rodrigo RanPerini, Mark, MD   Assessment & Plan: Visit Diagnoses:  1. S/P TKR (total knee replacement), right   2. Trigger finger, left middle finger     Plan: Patient is doing well 3 months status post right total knee replacement.  Antibiotic prophylaxis was discussed.  He will follow-up in about 9 months when he comes back from [place] for his one-year visit.  He will need 2 view x-rays of the right knee.  In terms of his middle trigger finger this was injected today in the office.  Patient tolerates well.  Follow-Up Instructions: Return in about 9 months (around 06/02/2019).   Orders:  No orders of the defined types were placed in this encounter.  No orders of the defined types were placed in this encounter.     Procedures: Hand/UE Inj: L long A1 for trigger finger on 09/01/2018 9:15 AM Indications: pain Details: 25 G needle Medications: 0.3 mL lidocaine 1 %; 0.33 mL bupivacaine 0.5 %; 13.33 mg methylPREDNISolone acetate 40 MG/ML Outcome: tolerated well, no immediate complications Consent was given by the patient. Patient was prepped and draped in the usual sterile fashion.       Clinical Data: No additional findings.   Subjective: Chief Complaint  Patient presents with  . Right Knee - Routine Post Op    Patient is approximately 3 months status post right total knee replacement.  He is doing well overall.  He is also complaining of a left middle trigger finger.  He would like an injection for this.  In terms of his right knee he is doing better.  He is progressing with home exercise and strengthening.   Review of Systems  Constitutional: Negative.   All other systems reviewed and are negative.    Objective: Vital Signs: There were no vitals taken  for this visit.  Physical Exam  Constitutional: He is oriented to person, place, and time. He appears well-developed and well-nourished.  Pulmonary/Chest: Effort normal.  Abdominal: Soft.  Neurological: He is alert and oriented to person, place, and time.  Skin: Skin is warm.  Psychiatric: He has a normal mood and affect. His behavior is normal. Judgment and thought content normal.  Nursing note and vitals reviewed.   Ortho Exam Right knee exam shows a fully healed surgical scar.  His range of motion is 5 to 125 degrees.  Collaterals are stable. Left hand exam shows a triggering middle finger.  He is tender A1 pulley. Specialty Comments:  No specialty comments available.  Imaging: No results found.   PMFS History: Patient Active Problem List   Diagnosis Date Noted  . Osteoarthritis of right knee 06/12/2018  . S/P TKR (total knee replacement), right 06/12/2018  . Persistent atrial fibrillation (HCC) 11/12/2015  . Essential hypertension 11/12/2015   Past Medical History:  Diagnosis Date  . A-fib (HCC)   . Arthritis   . Hypertension   . Macular degeneration of left eye   . Primary localized osteoarthritis of right knee     Family History  Problem Relation Age of Onset  . Kidney failure Brother     Past Surgical History:  Procedure Laterality Date  . ATRIAL FIBRILLATION ABLATION  03/2016  . FOOT ARTHRODESIS, MODIFIED MCBRIDE Right 05/1983  . HIP SURGERY Left 04/1999 AND 10/1994   REPLACEMENT X2  . INGUINAL HERNIA REPAIR Left 07/2012   DOUBLE HERNIA "on the left side"  . INGUINAL HERNIA REPAIR Right 05/1997  . JOINT REPLACEMENT    . KNEE CARTILAGE SURGERY Left 04/1998   CARTILAGE  . NOSE SURGERY  1959  . ROTATOR CUFF REPAIR Right 07/2002   SHOULDER  . TOTAL KNEE ARTHROPLASTY Left 10/1999 and 1969   TIMES [phone]  . TOTAL KNEE ARTHROPLASTY Right 06/12/2018   Procedure: RIGHT TOTAL KNEE ARTHROPLASTY;  Surgeon: Tarry Kos, MD;  Location: [facility] OR;  Service:  Orthopedics;  Laterality: Right;   Social History   Occupational History  . Not on file  Tobacco Use  . Smoking status: Former Games developer  . Smokeless tobacco: Former Engineer, water and Sexual Activity  . Alcohol use: Yes    Alcohol/week: 0.0 standard drinks    Comment: ONE GLASS OF WINE PER WEEK   . Drug use: No  . Sexual activity: Not on file

## 2019-12-21 ENCOUNTER — Other Ambulatory Visit: Payer: Self-pay

## 2019-12-21 ENCOUNTER — Ambulatory Visit (INDEPENDENT_AMBULATORY_CARE_PROVIDER_SITE_OTHER): Payer: Medicare Other | Admitting: Podiatry

## 2019-12-21 VITALS — Temp 98.3°F

## 2019-12-21 DIAGNOSIS — M79675 Pain in left toe(s): Secondary | ICD-10-CM

## 2019-12-21 DIAGNOSIS — M79674 Pain in right toe(s): Secondary | ICD-10-CM | POA: Diagnosis not present

## 2019-12-21 DIAGNOSIS — T148XXA Other injury of unspecified body region, initial encounter: Secondary | ICD-10-CM

## 2019-12-21 DIAGNOSIS — B351 Tinea unguium: Secondary | ICD-10-CM

## 2019-12-21 DIAGNOSIS — L600 Ingrowing nail: Secondary | ICD-10-CM | POA: Diagnosis not present

## 2019-12-21 NOTE — Progress Notes (Signed)
Subjective: 86 year old male presents to the office today for concerns of possible ingrown toenail to his left big toe, lateral aspect.  He denies any redness or drainage or any swelling he has noticed pain.  Also all of his nails are thick and discolored he cannot trim them himself and cause irritation with shoes.  Also this morning woke up he is concerned he may be started developed gout in his right ankle.  He has a small spot on his ankle.  He is on gabapentin 200 mg daily. Denies any systemic complaints such as fevers, chills, nausea, vomiting. No acute changes since last appointment, and no other complaints at this time.   Objective: AAO x3, NAD DP/PT pulses palpable bilaterally, CRT less than 3 seconds-pitting edema present bilaterally Nails are hypertrophic, dystrophic, brittle, discolored, elongated 10.  Specifically on the hallux toenails bilaterally the left side worse than the right incurvation of the distal aspect.  There is no drainage or pus or any signs of infection noted today there is no edema, erythema.  On the medial aspect the right ankle over the area he is concerned about gout there is a small area of skin breakdown with minimal surrounding erythema.  There is no drainage or pus.  This is superficial and almost appears to be a small skin tear.  No surrounding redness or drainage. Tenderness nails 1-5 bilaterally. No open lesions or pre-ulcerative lesions are identified today. No pain with calf compression, swelling, warmth, erythema  Assessment: 86 year old male symptomatic onychomycosis, ingrowing toenail; skin abrasion right ankle  Plan: -All treatment options discussed with the patient including all alternatives, risks, complications.  -Debrided nails x10 without any complications or bleeding.  Able to remove the symptomatic portion of the ingrowing nail with any complications or bleeding.  Monitoring signs or symptoms of infection.  Should there be any worsening of ingrown  toenail any further concerns will need to have a partial nail avulsion. -Recommend a small amount of antibiotic ointment dressings of the right ankle daily.  Offloading.  Monitoring signs or symptoms of infection.  If not healed next week to let me know or sooner if there is any worsening. -Patient encouraged to call the office with any questions, concerns, change in symptoms.   Vivi Barrack DPM

## 2019-12-21 NOTE — Patient Instructions (Signed)
Apply a small amount of antibiotic ointment and bandage to the small wound on the right ankle. If you notice any increase in pain, swelling, drainage or any other signs of infection please call me at [phone]

## 2020-01-01 ENCOUNTER — Ambulatory Visit (INDEPENDENT_AMBULATORY_CARE_PROVIDER_SITE_OTHER): Payer: Medicare Other | Admitting: Podiatry

## 2020-01-01 ENCOUNTER — Other Ambulatory Visit: Payer: Self-pay

## 2020-01-01 DIAGNOSIS — Z7901 Long term (current) use of anticoagulants: Secondary | ICD-10-CM | POA: Diagnosis not present

## 2020-01-01 DIAGNOSIS — L600 Ingrowing nail: Secondary | ICD-10-CM | POA: Diagnosis not present

## 2020-01-01 MED ORDER — CEPHALEXIN 500 MG PO CAPS: 500.0000 mg | ORAL_CAPSULE | Freq: Two times a day (BID) | ORAL | 0 refills | Status: DC

## 2020-01-01 NOTE — Patient Instructions (Signed)

## 2020-01-07 DIAGNOSIS — L600 Ingrowing nail: Secondary | ICD-10-CM | POA: Insufficient documentation

## 2020-01-07 NOTE — Progress Notes (Signed)
Subjective: 86 year old male presents the office today for concerns of still having pain to the left big toenail, lateral aspect.  Area is tender but denies any drainage or pus.  No recent injury. Denies any systemic complaints such as fevers, chills, nausea, vomiting. No acute changes since last appointment, and no other complaints at this time.   Objective: AAO x3, NAD DP/PT pulses palpable bilaterally, CRT less than 3 seconds Incurvation present to the lateral aspect the left hallux toenail.  Minimal edema to the area but there is no drainage or pus.  There is no ascending cellulitis.  There is no other signs of infection. No open lesions or pre-ulcerative lesions.  No pain with calf compression, swelling, warmth, erythema  Assessment: Left lateral hallux symptomatic ingrown toenail  Plan: -All treatment options discussed with the patient including all alternatives, risks, complications.  -There is sharp debrided the nail without any complications.  After debridement of the nail he thinks that all the nail came out it was causing discomfort.  Recommend Epson salt soaks daily as well as antibiotic ointment dressing changes daily.  I will see him back next week and at that point if we need to do the partial nail avulsion we will and we will contact his cardiologist in regards to stopping blood thinners. -Patient encouraged to call the office with any questions, concerns, change in symptoms.   Vivi Barrack DPM

## 2020-01-08 ENCOUNTER — Telehealth: Payer: Self-pay | Admitting: *Deleted

## 2020-01-08 ENCOUNTER — Telehealth: Payer: Self-pay | Admitting: Podiatry

## 2020-01-08 ENCOUNTER — Ambulatory Visit: Payer: Medicare Other | Admitting: Podiatry

## 2020-01-08 NOTE — Telephone Encounter (Signed)
Called and left a message for the patient to call me back concerning the big toe nail. Misty Stanley

## 2020-01-08 NOTE — Telephone Encounter (Signed)
Pt was returning [place] call from earlier. He wanted to let Dr.Wagoner know that he is feeling better, and that he taking his  medicationand doing the soaks hes feeling much better.

## 2021-08-12 ENCOUNTER — Other Ambulatory Visit: Payer: Self-pay | Admitting: Physician Assistant

## 2021-08-12 ENCOUNTER — Other Ambulatory Visit: Payer: Self-pay | Admitting: Orthopedic Surgery

## 2021-08-12 ENCOUNTER — Other Ambulatory Visit (HOSPITAL_COMMUNITY): Payer: Self-pay | Admitting: Orthopedic Surgery

## 2021-08-12 DIAGNOSIS — M25551 Pain in right hip: Secondary | ICD-10-CM

## 2021-08-12 DIAGNOSIS — Z96641 Presence of right artificial hip joint: Secondary | ICD-10-CM

## 2021-08-13 ENCOUNTER — Ambulatory Visit (INDEPENDENT_AMBULATORY_CARE_PROVIDER_SITE_OTHER): Payer: Medicare Other | Admitting: Podiatry

## 2021-08-13 ENCOUNTER — Encounter: Payer: Self-pay | Admitting: Podiatry

## 2021-08-13 ENCOUNTER — Other Ambulatory Visit: Payer: Self-pay

## 2021-08-13 DIAGNOSIS — M79675 Pain in left toe(s): Secondary | ICD-10-CM | POA: Diagnosis not present

## 2021-08-13 DIAGNOSIS — B351 Tinea unguium: Secondary | ICD-10-CM

## 2021-08-13 DIAGNOSIS — M79674 Pain in right toe(s): Secondary | ICD-10-CM | POA: Diagnosis not present

## 2021-08-13 NOTE — Progress Notes (Signed)
Subjective:   Patient ID: Craig Mann, male   DOB: 87 y.o.   MRN: [id number]   HPI Patient presents stating all of his nails are thickened and ingrown in the corners and gets sore and he cannot   ROS      Objective:  Physical Exam  Neurovascular status intact with thick yellow brittle nailbeds 1-5 both feet moderately painful     Assessment:  Mycotic nail infection with pain 1-5 both feet     Plan:  Debridement of painful nailbeds 1-5 both feet no iatrogenic bleeding noted

## 2021-09-03 ENCOUNTER — Ambulatory Visit (HOSPITAL_COMMUNITY)
Admission: RE | Admit: 2021-09-03 | Discharge: 2021-09-03 | Disposition: A | Discharge status: Home / Self Care | Payer: Medicare Other | Source: Ambulatory Visit | Attending: Orthopedic Surgery | Admitting: Orthopedic Surgery

## 2021-09-03 ENCOUNTER — Encounter (HOSPITAL_COMMUNITY)
Admission: RE | Admit: 2021-09-03 | Discharge: 2021-09-03 | Disposition: A | Discharge status: Home / Self Care | Payer: Medicare Other | Source: Ambulatory Visit | Attending: Orthopedic Surgery | Admitting: Orthopedic Surgery

## 2021-09-03 ENCOUNTER — Other Ambulatory Visit: Payer: Self-pay

## 2021-09-03 DIAGNOSIS — Z96641 Presence of right artificial hip joint: Secondary | ICD-10-CM | POA: Diagnosis present

## 2021-09-03 DIAGNOSIS — M25551 Pain in right hip: Secondary | ICD-10-CM

## 2021-09-03 MED ORDER — TECHNETIUM TC 99M MEDRONATE IV KIT
20.0000 | PACK | Freq: Once | INTRAVENOUS | Status: AC | PRN
  Administered 2021-09-03: 20 via INTRAVENOUS

## 2021-09-24 ENCOUNTER — Other Ambulatory Visit: Payer: Self-pay

## 2021-09-24 ENCOUNTER — Ambulatory Visit (INDEPENDENT_AMBULATORY_CARE_PROVIDER_SITE_OTHER): Payer: Medicare Other | Admitting: Podiatry

## 2021-09-24 ENCOUNTER — Encounter: Payer: Self-pay | Admitting: Podiatry

## 2021-09-24 DIAGNOSIS — L600 Ingrowing nail: Secondary | ICD-10-CM

## 2021-09-24 NOTE — Patient Instructions (Signed)

## 2021-09-24 NOTE — Progress Notes (Signed)
Subjective:   Patient ID: Craig Mann, male   DOB: 87 y.o.   MRN: [id number]   HPI Patient presents stating his left big toenail is really been bothering him and it feels ingrown and he cannot take care of himself   ROS      Objective:  Physical Exam  Neurovascular status intact with incurvated lateral border left hallux painful when pressed no active drainage no redness noted but painful     Assessment:  Ingrown toenail deformity left hallux lateral border with pain     Plan:  H&P reviewed condition recommended removal of the nail border explained procedure risk patient wants surgery understanding risk and signed consent form.  I infiltrated the left hallux 60 mg like Marcaine mixture sterile prep done and using sterile instrumentation remove the lateral border exposed matrix applied phenol 3 applications 30 seconds followed by alcohol lavage sterile dressing gave instructions on soaks leave dressing on 24 hours but take it off earlier if any throbbing were to occur and encouraged to call with questions concerns which may arise

## 2021-09-25 ENCOUNTER — Telehealth: Payer: Self-pay | Admitting: *Deleted

## 2021-09-25 NOTE — Telephone Encounter (Signed)
Patient is calling and wanting clarification on soaking  his feet after nail procedure,and if he should take off bandage or leave it on for the first soak as instructed in office.  Returned the call back to patient, no answer, left message to follow the instructions giving in office.

## 2021-10-14 ENCOUNTER — Ambulatory Visit: Payer: Medicare Other | Admitting: Podiatry

## 2021-11-27 ENCOUNTER — Ambulatory Visit (INDEPENDENT_AMBULATORY_CARE_PROVIDER_SITE_OTHER): Payer: Medicare Other | Admitting: Podiatry

## 2021-11-27 ENCOUNTER — Other Ambulatory Visit: Payer: Self-pay

## 2021-11-27 DIAGNOSIS — M79674 Pain in right toe(s): Secondary | ICD-10-CM | POA: Diagnosis not present

## 2021-11-27 DIAGNOSIS — B353 Tinea pedis: Secondary | ICD-10-CM

## 2021-11-27 DIAGNOSIS — B351 Tinea unguium: Secondary | ICD-10-CM | POA: Diagnosis not present

## 2021-11-27 DIAGNOSIS — M79675 Pain in left toe(s): Secondary | ICD-10-CM

## 2021-11-27 MED ORDER — KETOCONAZOLE 2 % EX CREA: TOPICAL_CREAM | CUTANEOUS | 0 refills | Status: DC

## 2021-11-27 NOTE — Patient Instructions (Signed)
Athlete's Foot Athlete's foot (tinea pedis) is a fungal infection of the skin on your feet. It often occurs on the skin that is between or underneath the toes. It can also occur on the soles of your feet. The infection can spread from person to person (is contagious). It can also spread when a person's bare feet come in contact with the fungus on shower floors or on items such as shoes. What are the causes? This condition is caused by a fungus that grows in warm, moist places. You can get athlete's foot by sharing shoes, shower stalls, towels, and wet floors with someone who is infected. Not washing your feet or changing your socks often enough can also lead to athlete's foot. What increases the risk? This condition is more likely to develop in: Men. People who have a weak body defense system (immune system). People who have diabetes. People who use public showers, such as at a gym. People who wear heavy-duty shoes, such as industrial or military shoes. Seasons with warm, humid weather. What are the signs or symptoms? Symptoms of this condition include: Itchy areas between your toes or on the soles of your feet. White, flaky, or scaly areas between your toes or on the soles of your feet. Very itchy small blisters between your toes or on the soles of your feet. Small cuts in your skin. These cuts can become infected. Thick or discolored toenails. How is this diagnosed? This condition may be diagnosed with a physical exam and a review of your medical history. Your health care provider may also take a skin or toenail sample to examine under a microscope. How is this treated? This condition is treated with antifungal medicines. These may be applied as powders, ointments, or creams. In severe cases, an oral antifungal medicine may be given. Follow these instructions at home: Medicines Apply or take over-the-counter and prescription medicines only as told by your health care provider. Apply your  antifungal medicine as told by your health care provider. Do not stop using the antifungal even if your condition improves. Foot care Do not scratch your feet. Keep your feet dry: Wear cotton or wool socks. Change your socks every day or if they become wet. Wear shoes that allow air to flow, such as sandals or canvas tennis shoes. Wash and dry your feet, including the area between your toes. Also, wash and dry your feet: Every day or as told by your health care provider. After exercising. General instructions Do not let others use towels, shoes, nail clippers, or other personal items that touch your feet. Protect your feet by wearing sandals in wet areas, such as locker rooms and shared showers. Keep all follow-up visits. This is important. If you have diabetes, keep your blood sugar under control. Contact a health care provider if: You have a fever. You have swelling, soreness, warmth, or redness in your foot. Your feet are not getting better with treatment. Your symptoms get worse. You have new symptoms. You have severe pain. Summary Athlete's foot (tinea pedis) is a fungal infection of the skin on your feet. It often occurs on skin that is between or underneath the toes. This condition is caused by a fungus that grows in warm, moist places. Symptoms include white, flaky, or scaly areas between your toes or on the soles of your feet. This condition is treated with antifungal medicines. Keep your feet clean. Always dry them thoroughly. This information is not intended to replace advice given to you by   your health care provider. Make sure you discuss any questions you have with your health care provider. Document Revised: 03/22/2021 Document Reviewed: 03/22/2021 Elsevier Patient Education  2022 Elsevier Inc.    

## 2021-12-03 ENCOUNTER — Encounter: Payer: Self-pay | Admitting: Podiatry

## 2021-12-03 NOTE — Progress Notes (Signed)
°  Subjective:  Patient ID: TAFARI HUMISTON, male    DOB: 1933/01/08,  MRN: [id number]  NIXON KOLTON presents to clinic today for painful thick toenails that are difficult to trim. Pain interferes with ambulation. Aggravating factors include wearing enclosed shoe gear. Pain is relieved with periodic professional debridement.  Patient   PCP is Rodrigo Ran, MD.  Allergies  Allergen Reactions   Hydrochlorothiazide     Other reaction(s): Headache, Drowsy, Headache, Drowsy   Influenza Vaccines     Other reaction(s): Fever   Lisinopril     Other reaction(s): Cough    Review of Systems: Negative except as noted in the HPI. Objective:   Constitutional GERIK COBERLY is a pleasant 87 y.o. Caucasian male, morbidly obese in NAD. AAO x 3.   Vascular CFT immediate b/l LE. Palpable DP/PT pulses b/l LE. Digital hair sparse b/l. Skin temperature gradient WNL b/l. No pain with calf compression b/l. No edema noted b/l. No cyanosis or clubbing noted b/l LE.  Neurologic Normal speech. Oriented to person, place, and time. Protective sensation intact 5/5 intact bilaterally with 10g monofilament b/l. Vibratory sensation intact b/l.  Dermatologic No open wounds b/l LE. No interdigital macerations noted b/l LE. Toenails 1-5 b/l elongated, discolored, dystrophic, thickened, crumbly with subungual debris and tenderness to dorsal palpation. Scaling noted dorsal aspect of digits..  Orthopedic: Muscle strength 5/5 to all lower extremity muscle groups bilaterally. No pain, crepitus or joint limitation noted with ROM bilateral LE.   Radiographs: None   Assessment:   1. Pain due to onychomycosis of toenails of both feet   2. Tinea pedis of both feet    Plan:  Patient was evaluated and treated and all questions answered. Consent given for treatment as described below: -Examined patient. -Patient to continue soft, supportive shoe gear daily. -Mycotic toenails 1-5 bilaterally were debrided in length and  girth with sterile nail nippers and dremel without incident. -For tinea pedis, Rx sent to pharmacy for Ketoconazole Cream 2% to be applied once daily for six weeks. -Patient/POA to call should there be question/concern in the interim.  Return in about 5 months (around 04/27/2022).  Freddie Breech, DPM

## 2021-12-23 ENCOUNTER — Encounter: Payer: Self-pay | Admitting: Podiatry

## 2021-12-23 ENCOUNTER — Ambulatory Visit (INDEPENDENT_AMBULATORY_CARE_PROVIDER_SITE_OTHER): Payer: Medicare Other | Admitting: Podiatry

## 2021-12-23 ENCOUNTER — Other Ambulatory Visit: Payer: Self-pay

## 2021-12-23 DIAGNOSIS — L600 Ingrowing nail: Secondary | ICD-10-CM

## 2021-12-23 NOTE — Progress Notes (Signed)
Subjective:   Patient ID: Craig Mann, male   DOB: 88 y.o.   MRN: [id number]   HPI Patient presents stating left nails healing well right nail is ingrown and sore   ROS      Objective:  Physical Exam  Neurovascular status intact with incurvated medial border right hallux painful when pressed that is digging into the skin with redness and no drainage noted     Assessment:  Ingrown toenail deformity right hallux with pain medial border right hallux     Plan:  H&P reviewed condition recommended correction of deformity explained procedure risk and patient wants this fixed.  I explained surgery for this and reviewed what would be done and patient is willing to accept risk wants the procedure and signed consent form.  I infiltrated the right hallux 60 mg like Marcaine mixture sterile prep done and using sterile instrumentation remove the medial border exposed matrix applied phenol 3 applications 30 seconds followed by alcohol lavage sterile dressing gave instructions for soaks leave dressing on 24 hours take off earlier if any throbbing were to occur and encouraged to call questions concerns

## 2021-12-23 NOTE — Patient Instructions (Signed)

## 2021-12-24 ENCOUNTER — Ambulatory Visit (INDEPENDENT_AMBULATORY_CARE_PROVIDER_SITE_OTHER): Payer: Medicare Other | Admitting: Podiatry

## 2021-12-24 DIAGNOSIS — L600 Ingrowing nail: Secondary | ICD-10-CM

## 2022-04-27 ENCOUNTER — Ambulatory Visit: Payer: Medicare Other | Admitting: Podiatry

## 2022-07-18 NOTE — Progress Notes (Signed)
Subjective:   Patient ID: Craig Mann, male   DOB: 88 y.o.   MRN: [id number]   HPI Patient presents stating concerned about an ingrown toenail removal 1 day ago and that there is been bleeding   ROS      Objective:  Physical Exam  Neurovascular status intact it appears right now to be healing uneventfully but there is some bleeding     Assessment:  Excessive bleeding after having ingrown toenail removed yesterday     Plan:  Applied Lumicain to it applied sterile dressing with compression it should heal uneventfully reappoint if any indications of problems occur

## 2023-06-10 IMAGING — NM NM BONE 3 PHASE
10 series · 20 of 20 positions shown · non-contrast
Comparison: None

Radiographic correlation: None

CLINICAL DATA: RIGHT hip pain for 6-12 months, prior RIGHT hip
arthroplasty 1003, LEFT hip arthroplasty 8110

EXAM:
NUCLEAR MEDICINE 3-PHASE BONE SCAN
TECHNIQUE: Radionuclide angiographic images, immediate static blood pool
images, and 3-hour delayed static images were obtained of the hips
after intravenous injection of radiopharmaceutical.
RADIOPHARMACEUTICALS:  20.4 mCi 9c-UUm MDP IV

[Series 1: flow · 2.07mm/px · 6 of 48 frames shown (1 of 2)]
[frame 5/48  full-range]
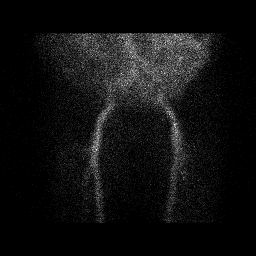
[frame 13/48  full-range]
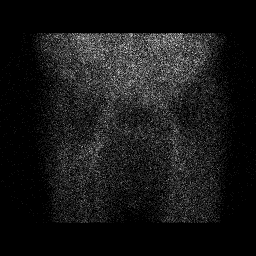
[frame 21/48  full-range]
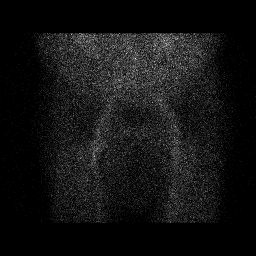
[frame 29/48  full-range]
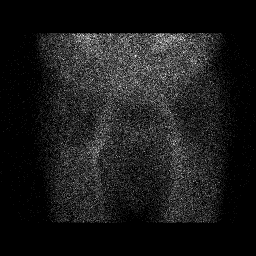
[frame 37/48  full-range]
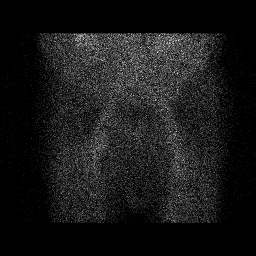
[frame 45/48  full-range]
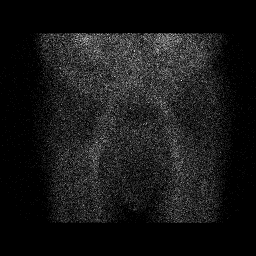

[Series 1: flow · 2.07mm/px · 6 of 48 frames shown (2 of 2)]
[frame 5/48  full-range]
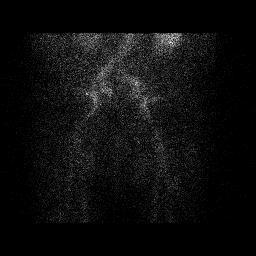
[frame 13/48  full-range]
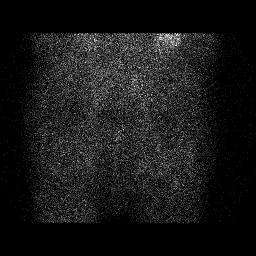
[frame 21/48  full-range]
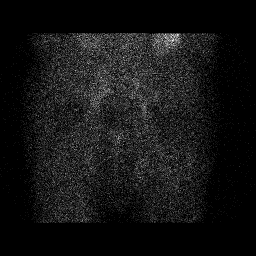
[frame 29/48  full-range]
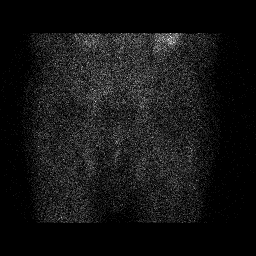
[frame 37/48  full-range]
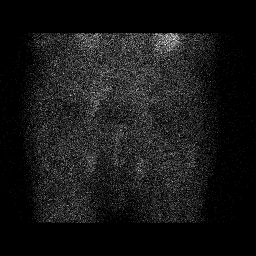
[frame 45/48  full-range]
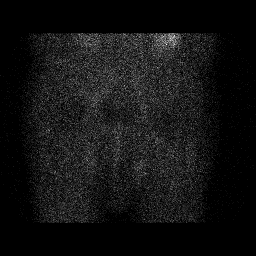

[Series 2: blood pool · 2.07mm/px · 1 of 1 slices shown (1 of 2)]
[im 1/1]
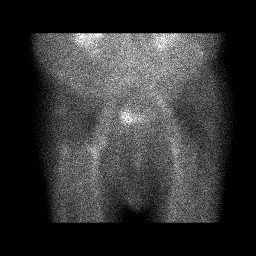

[Series 2: blood pool · 2.07mm/px · 1 of 1 slices shown (2 of 2)]
[im 1/1]
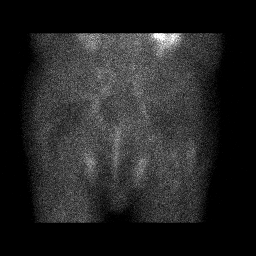

[Series 3: lat bp · 2.07mm/px · 1 of 1 slices shown (1 of 2)]
[im 1/1]
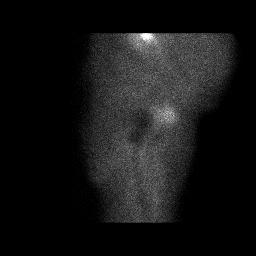

[Series 3: lat bp · 2.07mm/px · 1 of 1 slices shown (2 of 2)]
[im 1/1]
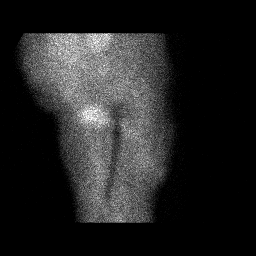

[Series 4: delay · delayed · 2.07mm/px · 1 of 1 slices shown (1 of 4)]
[im 1/1]
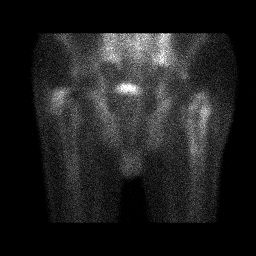

[Series 4: delay · delayed · 2.07mm/px · 1 of 1 slices shown (2 of 4)]
[im 1/1]
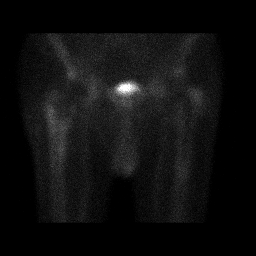

[Series 5: delay · delayed · 2.07mm/px · 1 of 1 slices shown (3 of 4)]
[im 1/1  full-range]
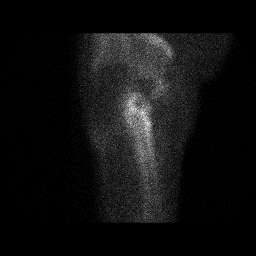

[Series 5: delay · delayed · 2.07mm/px · 1 of 1 slices shown (4 of 4)]
[im 1/1]
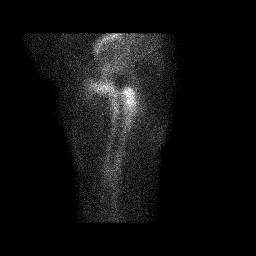

[20 of 20 positions shown; findings below may reference images not displayed]

FINDINGS: Vascular phase: Minimally increased blood flow lateral to the
proximal RIGHT femur

Blood pool phase: Mildly increased blood pool posterolaterally to
the proximal RIGHT femur

Delayed phase: Photopenic defects at both hips from hip prostheses.

Abnormal increased tracer localization is seen surrounding the
femoral component of the RIGHT hip prosthesis, consistent with
aseptic loosening or infection of the prosthesis. Focally increased
tracer uptake is seen at the LEFT greater trochanter, subtly at the
tip of the femoral component of the LEFT hip prosthesis, cannot
exclude aseptic loosening though this is nonspecific in the absence
of symptoms at this site. No abnormal tracer localization within
visualized pelvis.
IMPRESSION: Abnormal uptake of tracer surrounding the femoral component of the
RIGHT hip prosthesis either representing aseptic loosening or
infection.

Uptake adjacent to the proximal and distal aspects of the femoral
component of the LEFT hip prosthesis, nonspecific in an asymptomatic
hip, aseptic loosening not completely excluded.

## 2024-05-07 ENCOUNTER — Encounter (HOSPITAL_COMMUNITY): Payer: Self-pay | Admitting: Emergency Medicine

## 2024-05-07 ENCOUNTER — Other Ambulatory Visit: Payer: Self-pay

## 2024-05-07 ENCOUNTER — Emergency Department (HOSPITAL_COMMUNITY)
Admission: EM | Admit: 2024-05-07 | Discharge: 2024-05-07 | Disposition: A | Discharge status: Home / Self Care | Attending: Emergency Medicine | Admitting: Emergency Medicine

## 2024-05-07 DIAGNOSIS — R600 Localized edema: Secondary | ICD-10-CM | POA: Diagnosis not present

## 2024-05-07 DIAGNOSIS — T148XXA Other injury of unspecified body region, initial encounter: Secondary | ICD-10-CM

## 2024-05-07 DIAGNOSIS — Z7901 Long term (current) use of anticoagulants: Secondary | ICD-10-CM | POA: Diagnosis not present

## 2024-05-07 DIAGNOSIS — M79661 Pain in right lower leg: Secondary | ICD-10-CM | POA: Diagnosis present

## 2024-05-07 DIAGNOSIS — M7981 Nontraumatic hematoma of soft tissue: Secondary | ICD-10-CM | POA: Diagnosis not present

## 2024-05-07 NOTE — ED Triage Notes (Signed)
 Pt BIB [facility] from [facility] due to right leg swelling and pain.  Pt reports it started this morning 05/07/24.  Pt does take eliquis ; no fall reported.  VS BP 152/88, HR 67, Resp 16, SpO2 95%

## 2024-05-07 NOTE — Discharge Instructions (Signed)
 You were seen in the ER today for evaluation of your leg pain/swelling. I think this is likely a hematoma from your blood thinner. I have included more information on this into the discharge paperwork. Please review. Make sure that you are wrapping the area to help with the swelling and pain. Try and elevate the leg to help with this as well. Make sure to follow up with your primary care provider. If you have any other concerns, new or worsening symptoms, please return to the ER today for re-evaluation.   Contact a doctor if: You have a fever. The swelling or bruising gets worse. You start to get more hematomas. Your pain gets worse. Your pain is not getting better with medicine. The skin over the hematoma breaks or starts to bleed. Get help right away if: Your hematoma is in your chest or belly and you: Pass out. Feel weak. Become short of breath. You have a hematoma on your scalp that is caused by a fall or injury, and you: Have a headache that gets worse. Have trouble speaking or understanding speech. Become less alert or you pass out. These symptoms may be an emergency. Get help right away. Call 911. Do not wait to see if the symptoms will go away. Do not drive yourself to the hospital

## 2024-05-07 NOTE — ED Notes (Signed)
 Ace wrap applied to RLE. Pt given an extra roll to apply later after shower.

## 2024-05-07 NOTE — ED Provider Notes (Cosign Needed Addendum)
 [place] EMERGENCY DEPARTMENT AT [facility] Provider Note   CSN: [id number] Arrival date & time: 05/07/24  1448     History No chief complaint on file.   Craig Mann is a 89 y.o. male with h/o afib on Eliquis  presents to the ER today for evaluation of swelling and bruise noted to the right medial calf.  Patient reports he went to bed last night and did not notice anything there.  He reports he woke up this morning and turned over in bed he felt pain in the back of his Were never touch the mattress and looked and saw the bruise and swelling to the area.  He reports he did have some pain with walking to it however was still ambulatory without issue.  He denies any injury to the area.  He denies any worsening of the area but the patient's family member saw it and wanted him to be evaluated for this.  He denies any chest pain or shortness of breath.  Denies any fever.  HPI     Home Medications Prior to Admission medications   Medication Sig Start Date End Date Taking? Authorizing Provider  allopurinol (ZYLOPRIM) 100 MG tablet allopurinol 100 mg tablet  TAKE 1 TABLET BY MOUTH TWICE DAILY 11/14/19   [provider]  amLODipine  (NORVASC ) 5 MG tablet Take 5 mg by mouth daily with supper.    [provider]  cephALEXin  (KEFLEX ) 500 MG capsule Take 1 capsule (500 mg total) by mouth 2 (two) times daily. 01/01/20   Charity Conch, DPM  dabigatran (PRADAXA) 150 MG CAPS capsule Take 150 mg by mouth 2 (two) times daily.    [provider]  gabapentin  (NEURONTIN ) 300 MG capsule Take 300 mg by mouth 2 (two) times daily.    [provider]  ketoconazole  (NIZORAL ) 2 % cream Apply to both feet and between toes once daily for 6 weeks. 11/27/21   Luella Sager, DPM  metoprolol  tartrate (LOPRESSOR ) 50 MG tablet Take 50 mg by mouth 2 (two) times daily. With breakfast & supper    [provider]  Multiple Vitamins-Minerals (OCUVITE-LUTEIN PO)  Take 1 tablet by mouth 2 (two) times daily.    [provider]  Multiple Vitamins-Minerals (PRESERVISION AREDS 2 PO) Take 1 tablet by mouth 2 (two) times daily.    [provider]  olmesartan -hydrochlorothiazide  (BENICAR  HCT) 40-25 MG tablet Take 1 tablet by mouth daily with breakfast. 04/28/18   [provider]  Polyethyl Glycol-Propyl Glycol (SYSTANE) 0.4-0.3 % SOLN Place 1 drop into both eyes 2 (two) times daily. SYSTANE    [provider]  predniSONE (DELTASONE) 10 MG tablet TAKE ONE TABLET BY MOUTH DAILY  FOR SHORT-TERM USE FOR FLARE OF GOUT.  DO NOT TAKE COURSE MORE OFTEN THAN MONTHLY. 04/09/21   [provider]  traMADol (ULTRAM) 50 MG tablet Take 1 tablet by mouth 2 (two) times daily as needed. 04/09/21   [provider]      Allergies    Hydrochlorothiazide , Influenza vaccines, and Lisinopril    Review of Systems   Review of Systems  Constitutional:  Negative for fever.  Respiratory:  Negative for shortness of breath.   Cardiovascular:  Negative for chest pain.  See HPI  Physical Exam Updated Vital Signs BP (!) 136/91   Pulse 88   Temp 98.4 F (36.9 C) (Oral)   Resp 19   Ht 5\' 11"  (1.803 m)   Wt 90.7 kg  SpO2 98%   BMI 27.89 kg/m  Physical Exam Vitals and nursing note reviewed.  Constitutional:      General: He is not in acute distress.    Appearance: He is not ill-appearing or toxic-appearing.  Eyes:     General: No scleral icterus. Pulmonary:     Effort: Pulmonary effort is normal. No respiratory distress.  Musculoskeletal:     Right lower leg: Edema present.     Left lower leg: Edema present.     Comments: 1+ edema to bilateral lower extremities. Palpable DP and PT pulses. The patient has a hematoma present to the medial aspect of the right calf. Please see images. Compartments soft. Temperature fees symmetric bilaterally. No swelling or unilateral skin changes noted to the knees or bilateral upper legs.    Skin:    General: Skin is warm and dry.  Neurological:     Mental Status: He is alert.        ED Results / Procedures / Treatments   Labs (all labs ordered are listed, but only abnormal results are displayed) Labs Reviewed - No data to display  EKG None  Radiology No results found.  Procedures Procedures   Medications Ordered in ED Medications - No data to display  ED Course/ Medical Decision Making/ A&P   Medical Decision Making  89 y.o. male presents to the ER today for evaluation of right leg bruise. Differential diagnosis includes but is not limited to hematoma, bruise, contusion, cellulitis. Vital signs mildly elevated BP at 136/91, otherwise unremarkable. Physical exam as noted above.   Please see images for hematoma. Patient reports mild pain around a 2/10.  Compartments are soft.  He has palpable distal pulses.  There is area of bruising and swelling noted to the medial calf. Doubt compartment syndrome. Does not appear consistent with cellulitis. Denies any known injury. My attending assessed the patient at bedside with ultrasound and reports hematoma.  Recommends Ace bandages but no other workup required.  We encourage patient to elevate the leg as well as wearing the Ace bandage for helping with pain and swelling.  Recommend that he still continue his Eliquis  and discussed return precautions and recommended PCP follow-up.  Patient is neurovascular intact distally.  Stable for discharge home.  We discussed plan at bedside. We discussed strict return precautions and red flag symptoms. The patient verbalized their understanding and agrees to the plan. The patient is stable and being discharged home in good condition.  I discussed this case with my attending physician who cosigned this note including patient's presenting symptoms, physical exam, and planned diagnostics and interventions. Attending physician stated agreement with plan or made changes to plan which were  implemented.   Attending physician assessed patient at bedside.  Portions of this report may have been transcribed using voice recognition software. Every effort was made to ensure accuracy; however, inadvertent computerized transcription errors may be present.   Final Clinical Impression(s) / ED Diagnoses Final diagnoses:  Hematoma    Rx / DC Orders ED Discharge Orders     None         Spence Dux, PA-C 05/07/24 1621    Spence Dux, PA-C 05/07/24 1627    Almond Army, MD 05/08/24 1743

## 2024-05-07 NOTE — ED Notes (Addendum)
 Craig Mann

## 2024-05-07 NOTE — ED Notes (Signed)
 Pt had a family member to pick him up.

## 2024-06-07 ENCOUNTER — Ambulatory Visit (INDEPENDENT_AMBULATORY_CARE_PROVIDER_SITE_OTHER): Admitting: Orthopedic Surgery

## 2024-06-07 ENCOUNTER — Encounter: Payer: Self-pay | Admitting: Orthopedic Surgery

## 2024-06-07 DIAGNOSIS — S8011XA Contusion of right lower leg, initial encounter: Secondary | ICD-10-CM | POA: Diagnosis not present

## 2024-06-07 NOTE — Progress Notes (Signed)
 Office Visit Note   Patient: Craig Mann           Date of Birth: 08-21-1933           MRN: [id number] Visit Date: 06/07/2024              Requested by: Valentin Skates, DO [address] [place],  [place] [address] PCP: Valentin Skates, DO  Chief Complaint  Patient presents with   Right Leg - Wound Check      HPI: Patient is an 89 year old gentleman who is seen for initial evaluation for hematoma right leg.  Patient has undergone excellent conservative therapy was Corean Bohr.  Patient has a history of diabetes and neuropathy.  Patient states that he is on a blood thinner Eliquis  for A-fib.  Patient states that the hematoma started on May 25 without trauma.  Assessment & Plan: Visit Diagnoses:  1. Hematoma of right lower extremity, initial encounter     Plan: Recommended exercising and walking to help the calf muscle pump that he is swelling up the leg.  Recommended compression.  Patient is unable to get on a compression sock.  Will use an Ace wrap at this time.  Discussed the importance of elevating his feet above his heart.  Patient has monophasic pulses at the ankle and we will set him up for ankle-brachial indices at vascular vein surgery.  Follow-Up Instructions: Return in about 4 weeks (around 07/05/2024).   Ortho Exam  Patient is alert, oriented, no adenopathy, well-dressed, normal affect, normal respiratory effort. Examination patient has a hematoma approximately 5 cm in diameter posterior medial right calf.  There is no cellulitis no drainage no signs of infection.  Patient has venous insufficiency.  I cannot palpate a pulse.  The Doppler was used and he has monophasic dorsalis pedis and posterior tibial pulse.  Patient states he sleeps in a recliner with his legs dependent.  Patient states he has been on 2 different antibiotics there is no signs of infection at this time.    Imaging: No results found.   Labs: No results found for: HGBA1C, ESRSEDRATE,  CRP, LABURIC, REPTSTATUS, GRAMSTAIN, CULT, LABORGA   Lab Results  Component Value Date   ALBUMIN 4.0 06/01/2018   ALBUMIN 3.6 11/29/2015    No results found for: MG No results found for: VD25OH  No results found for: PREALBUMIN    Latest Ref Rng & Units 06/13/2018    3:49 AM 06/01/2018   10:34 AM 11/29/2015    1:31 PM  CBC EXTENDED  WBC 4.0 - 10.5 K/uL 11.5  6.9  6.7   RBC 4.22 - 5.81 MIL/uL 4.16  5.02  5.05   Hemoglobin 13.0 - 17.0 g/dL 87.3  84.6  84.6   HCT 39.0 - 52.0 % 38.2  46.7  47.1   Platelets 150 - 400 K/uL 129  147  144   NEUT# 1.7 - 7.7 K/uL  5.0    Lymph# 0.7 - 4.0 K/uL  1.0       There is no height or weight on file to calculate BMI.  Orders:  Orders Placed This Encounter  Procedures   VAS US  ABI WITH/WO TBI   No orders of the defined types were placed in this encounter.    Procedures: No procedures performed  Clinical Data: No additional findings.  ROS:  All other systems negative, except as noted in the HPI. Review of Systems  Objective: Vital Signs: There were no vitals taken for this visit.  Specialty Comments:  No specialty comments available.  PMFS History: Patient Active Problem List   Diagnosis Date Noted   Ingrown toenail 01/07/2020   Osteoarthritis of right knee 06/12/2018   S/P TKR (total knee replacement), right 06/12/2018   Persistent atrial fibrillation (HCC) 11/12/2015   Essential hypertension 11/12/2015   Past Medical History:  Diagnosis Date   A-fib (HCC)    Arthritis    Hypertension    Macular degeneration of left eye    Primary localized osteoarthritis of right knee     Family History  Problem Relation Age of Onset   Kidney failure Brother     Past Surgical History:  Procedure Laterality Date   ATRIAL FIBRILLATION ABLATION  03/2016   FOOT ARTHRODESIS, MODIFIED MCBRIDE Right 05/1983   HIP SURGERY Left 04/1999 AND 10/1994   REPLACEMENT X2   INGUINAL HERNIA REPAIR Left 07/2012   DOUBLE HERNIA  on the left side   INGUINAL HERNIA REPAIR Right 05/1997   JOINT REPLACEMENT     KNEE CARTILAGE SURGERY Left 04/1998   CARTILAGE   NOSE SURGERY  1959   ROTATOR CUFF REPAIR Right 07/2002   SHOULDER   TOTAL KNEE ARTHROPLASTY Left 10/1999 and 1969   TIMES 04/8035,8037   TOTAL KNEE ARTHROPLASTY Right 06/12/2018   Procedure: RIGHT TOTAL KNEE ARTHROPLASTY;  Surgeon: Jerri Kay HERO, MD;  Location: [facility] OR;  Service: Orthopedics;  Laterality: Right;   Social History   Occupational History   Not on file  Tobacco Use   Smoking status: Former   Smokeless tobacco: Former  Building services engineer status: Never Used  Substance and Sexual Activity   Alcohol  use: Yes    Alcohol /week: 0.0 standard drinks of alcohol     Comment: ONE GLASS OF WINE PER WEEK    Drug use: No   Sexual activity: Not on file

## 2024-06-08 ENCOUNTER — Other Ambulatory Visit: Payer: Self-pay

## 2024-06-13 ENCOUNTER — Ambulatory Visit (HOSPITAL_COMMUNITY)
Admission: RE | Admit: 2024-06-13 | Discharge: 2024-06-13 | Disposition: A | Discharge status: Home / Self Care | Source: Ambulatory Visit | Attending: Orthopedic Surgery | Admitting: Orthopedic Surgery

## 2024-06-13 DIAGNOSIS — S8011XA Contusion of right lower leg, initial encounter: Secondary | ICD-10-CM

## 2024-06-13 LAB — VAS US ABI WITH/WO TBI
Left ABI: 0.43
Right ABI: 1.18

## 2024-06-18 ENCOUNTER — Ambulatory Visit: Payer: Self-pay | Admitting: Orthopedic Surgery

## 2024-06-18 DIAGNOSIS — S8011XA Contusion of right lower leg, initial encounter: Secondary | ICD-10-CM

## 2024-06-18 NOTE — Addendum Note (Signed)
 Addended by: DESIDERIO LAYMON BROCKS on: 06/18/2024 10:35 AM   Modules accepted: Orders

## 2024-06-18 NOTE — Telephone Encounter (Signed)
-----   Message from Jerona LULLA Sage sent at 06/13/2024  3:30 PM EDT ----- Call patient.  He does have decreased arterial circulation to the right lower extremity.  Lets make a routine consult for vascular vein surgery. ----- Message ----- From: Interface, [employer] Sent: 06/13/2024   3:08 PM EDT To: Jerona Sage LULLA, MD

## 2024-06-18 NOTE — Telephone Encounter (Signed)
 Called, left detailed VM on answering machine, per DPR okay.  Will place referral to vascular and vein for routine consult

## 2024-06-25 ENCOUNTER — Telehealth: Payer: Self-pay | Admitting: Orthopedic Surgery

## 2024-06-25 NOTE — Telephone Encounter (Signed)
 Morna w/ [facility] contacted us  on patient with the wound on right lower leg due to fall. They put an unna boot on the patient and want pt to follow up here on Monday to remove the wrap. I will call patient to have him scheduled.

## 2024-06-26 NOTE — Telephone Encounter (Signed)
 SW pt's nephew. Pt scheduled for 07/03/23 and cancelled 07/05/24 appointment.

## 2024-07-02 ENCOUNTER — Ambulatory Visit (INDEPENDENT_AMBULATORY_CARE_PROVIDER_SITE_OTHER): Admitting: Orthopedic Surgery

## 2024-07-02 ENCOUNTER — Encounter: Payer: Self-pay | Admitting: Orthopedic Surgery

## 2024-07-02 DIAGNOSIS — S8011XA Contusion of right lower leg, initial encounter: Secondary | ICD-10-CM

## 2024-07-02 NOTE — Progress Notes (Signed)
 Office Visit Note   Patient: Craig Mann           Date of Birth: 1933/01/26           MRN: [id number] Visit Date: 07/02/2024              Requested by: Valentin Skates, DO [address] [place],  [place] [address] PCP: Valentin Skates, DO  Chief Complaint  Patient presents with   Right Leg - Wound Check      HPI: Patient is an 89 year old gentleman is seen in follow-up for ulceration secondary to a hematoma right lower extremity.  Patient had a Unna boot applied last week.                     recent ankle-brachial indices shows monophasic flow at the ankle on the right with a great toe pressure of 55.  Patient has an appointment with vascular surgery August 7.                                          Assessment & Plan: Visit Diagnoses:  1. Hematoma of right lower extremity, initial encounter     Plan: Will apply a compression wrap.  Will [facility] up with [facility] for weekly compression wraps.  With improved circulation we could consider enrollment in the venous leg study.  Follow-Up Instructions: Return in about 4 weeks (around 07/30/2024).   Ortho Exam  Patient is alert, oriented, no adenopathy, well-dressed, normal affect, normal respiratory effort. Examination patient has a flat healthy granulating tissue ulcer over the lateral right calf.  It measures 2 x 5 cm with flat healthy granulation tissue.  Patient does not have a palpable pulse.  There is no redness no cellulitis no odor.    Imaging: No results found.   Labs: No results found for: HGBA1C, ESRSEDRATE, CRP, LABURIC, REPTSTATUS, GRAMSTAIN, CULT, LABORGA   Lab Results  Component Value Date   ALBUMIN 4.0 06/01/2018   ALBUMIN 3.6 11/29/2015    No results found for: MG No results found for: VD25OH  No results found for: PREALBUMIN    Latest Ref Rng & Units 06/13/2018    3:49 AM 06/01/2018   10:34 AM 11/29/2015    1:31 PM  CBC EXTENDED  WBC 4.0 - 10.5 K/uL 11.5  6.9  6.7    RBC 4.22 - 5.81 MIL/uL 4.16  5.02  5.05   Hemoglobin 13.0 - 17.0 g/dL 87.3  84.6  84.6   HCT 39.0 - 52.0 % 38.2  46.7  47.1   Platelets 150 - 400 K/uL 129  147  144   NEUT# 1.7 - 7.7 K/uL  5.0    Lymph# 0.7 - 4.0 K/uL  1.0       There is no height or weight on file to calculate BMI.  Orders:  No orders of the defined types were placed in this encounter.  No orders of the defined types were placed in this encounter.    Procedures: No procedures performed  Clinical Data: No additional findings.  ROS:  All other systems negative, except as noted in the HPI. Review of Systems  Objective: Vital Signs: There were no vitals taken for this visit.  Specialty Comments:  No specialty comments available.  PMFS History: Patient Active Problem List   Diagnosis Date Noted   Ingrown toenail 01/07/2020  Osteoarthritis of right knee 06/12/2018   S/P TKR (total knee replacement), right 06/12/2018   Persistent atrial fibrillation (HCC) 11/12/2015   Essential hypertension 11/12/2015   Past Medical History:  Diagnosis Date   A-fib (HCC)    Arthritis    Hypertension    Macular degeneration of left eye    Primary localized osteoarthritis of right knee     Family History  Problem Relation Age of Onset   Kidney failure Brother     Past Surgical History:  Procedure Laterality Date   ATRIAL FIBRILLATION ABLATION  03/2016   FOOT ARTHRODESIS, MODIFIED MCBRIDE Right 05/1983   HIP SURGERY Left 04/1999 AND 10/1994   REPLACEMENT X2   INGUINAL HERNIA REPAIR Left 07/2012   DOUBLE HERNIA on the left side   INGUINAL HERNIA REPAIR Right 05/1997   JOINT REPLACEMENT     KNEE CARTILAGE SURGERY Left 04/1998   CARTILAGE   NOSE SURGERY  1959   ROTATOR CUFF REPAIR Right 07/2002   SHOULDER   TOTAL KNEE ARTHROPLASTY Left 10/1999 and 1969   TIMES 04/8035,8037   TOTAL KNEE ARTHROPLASTY Right 06/12/2018   Procedure: RIGHT TOTAL KNEE ARTHROPLASTY;  Surgeon: Jerri Kay HERO, MD;  Location: [facility] OR;   Service: Orthopedics;  Laterality: Right;   Social History   Occupational History   Not on file  Tobacco Use   Smoking status: Former   Smokeless tobacco: Former  Building services engineer status: Never Used  Substance and Sexual Activity   Alcohol  use: Yes    Alcohol /week: 0.0 standard drinks of alcohol     Comment: ONE GLASS OF WINE PER WEEK    Drug use: No   Sexual activity: Not on file

## 2024-07-03 ENCOUNTER — Telehealth: Payer: Self-pay

## 2024-07-03 NOTE — Telephone Encounter (Signed)
 SW [place], let her know that he will be a 3 layer compression wrap weekly. They will go ahead and order the supplies for the wrap and do this weekly for him. I will email her the orders to [email or web address]

## 2024-07-03 NOTE — Telephone Encounter (Signed)
 Jody with [facility] called checking on new orders for this patient. He was seen yesterday and the daughter told them that the new orders were going to be sent over to them, but they have not received any. The nurse is going out to see him this morning.  Please call and advise [phone]

## 2024-07-05 ENCOUNTER — Ambulatory Visit: Admitting: Orthopedic Surgery

## 2024-07-19 ENCOUNTER — Ambulatory Visit: Admitting: Vascular Surgery

## 2024-07-26 ENCOUNTER — Encounter: Payer: Self-pay | Admitting: Gastroenterology

## 2024-08-06 ENCOUNTER — Ambulatory Visit: Admitting: Orthopedic Surgery

## 2024-09-18 NOTE — Progress Notes (Unsigned)
 Craig Mann [id number] 26-Oct-1933   Chief Complaint:  Referring Provider: [facility]* Primary GI MD: Sampson  HPI: Craig Mann is a 89 y.o. male with past medical history of A-fib s/p ablation 2017, HTN, arthritis, CHF, CKD stage III, fatty liver, T2DM who presents today for a complaint of *** .    Patient is referred by the [facility] for abnormal imaging showing ascites, hepatic steatosis, splenomegaly, for further evaluation/testing/surveillance/treatment.  Medications include amiodarone, apixaban , empagliflozin, furosemide, losartan   On abdominal ultrasound found to have increased hepatic echogenicity (most commonly hepatic steatosis) splenomegaly, small volume ascites, pleural effusions, gallstones and sludge within a nondistended gallbladder with negative sonographic Murphy sign and no biliary duct dilation.  Diffuse gallbladder wall thickening may relate to volume or nutritional factors such as hypoalbuminemia.  Bilateral renal cysts.  Per note ascites and pleural effusions may be related to heart and/or liver issues and advised follow-up with cardiology.   Previous GI Procedures/Imaging      Past Medical History:  Diagnosis Date   A-fib (HCC)    Arthritis    Hypertension    Macular degeneration of left eye    Primary localized osteoarthritis of right knee     Past Surgical History:  Procedure Laterality Date   ATRIAL FIBRILLATION ABLATION  03/2016   FOOT ARTHRODESIS, MODIFIED MCBRIDE Right 05/1983   HIP SURGERY Left 04/1999 AND 10/1994   REPLACEMENT X2   INGUINAL HERNIA REPAIR Left 07/2012   DOUBLE HERNIA on the left side   INGUINAL HERNIA REPAIR Right 05/1997   JOINT REPLACEMENT     KNEE CARTILAGE SURGERY Left 04/1998   CARTILAGE   NOSE SURGERY  1959   ROTATOR CUFF REPAIR Right 07/2002   SHOULDER   TOTAL KNEE ARTHROPLASTY Left 10/1999 and 1969   TIMES 04/8035,8037   TOTAL KNEE ARTHROPLASTY Right 06/12/2018   Procedure: RIGHT TOTAL KNEE  ARTHROPLASTY;  Surgeon: Jerri Kay HERO, MD;  Location: [facility] OR;  Service: Orthopedics;  Laterality: Right;    Current Outpatient Medications  Medication Sig Dispense Refill   allopurinol (ZYLOPRIM) 100 MG tablet allopurinol 100 mg tablet  TAKE 1 TABLET BY MOUTH TWICE DAILY     amLODipine  (NORVASC ) 5 MG tablet Take 5 mg by mouth daily with supper.     cephALEXin  (KEFLEX ) 500 MG capsule Take 1 capsule (500 mg total) by mouth 2 (two) times daily. 20 capsule 0   dabigatran (PRADAXA) 150 MG CAPS capsule Take 150 mg by mouth 2 (two) times daily.     gabapentin  (NEURONTIN ) 300 MG capsule Take 300 mg by mouth 2 (two) times daily.     ketoconazole  (NIZORAL ) 2 % cream Apply to both feet and between toes once daily for 6 weeks. 60 g 0   metoprolol  tartrate (LOPRESSOR ) 50 MG tablet Take 50 mg by mouth 2 (two) times daily. With breakfast & supper     Multiple Vitamins-Minerals (OCUVITE-LUTEIN PO) Take 1 tablet by mouth 2 (two) times daily.     Multiple Vitamins-Minerals (PRESERVISION AREDS 2 PO) Take 1 tablet by mouth 2 (two) times daily.     olmesartan -hydrochlorothiazide  (BENICAR  HCT) 40-25 MG tablet Take 1 tablet by mouth daily with breakfast.  12   Polyethyl Glycol-Propyl Glycol (SYSTANE) 0.4-0.3 % SOLN Place 1 drop into both eyes 2 (two) times daily. SYSTANE     predniSONE (DELTASONE) 10 MG tablet TAKE ONE TABLET BY MOUTH DAILY  FOR SHORT-TERM USE FOR FLARE OF GOUT.  DO NOT TAKE COURSE MORE OFTEN THAN  MONTHLY.     traMADol (ULTRAM) 50 MG tablet Take 1 tablet by mouth 2 (two) times daily as needed.     No current facility-administered medications for this visit.    Allergies as of 09/19/2024 - Review Complete 07/02/2024  Allergen Reaction Noted   Hydrochlorothiazide   03/29/2005   Influenza vaccines  10/06/2017   Lisinopril  12/01/2017    Family History  Problem Relation Age of Onset   Kidney failure Brother     Social History   Tobacco Use   Smoking status: Former   Smokeless tobacco:  Former  Building services engineer status: Never Used  Substance Use Topics   Alcohol  use: Yes    Alcohol /week: 0.0 standard drinks of alcohol     Comment: ONE GLASS OF WINE PER WEEK    Drug use: No     Review of Systems:    Constitutional: No weight loss, fever, chills, weakness or fatigue Eyes: No change in vision Ears, Nose, Throat:  No change in hearing or congestion Skin: No rash or itching Cardiovascular: No chest pain, chest pressure or palpitations   Respiratory: No SOB or cough Gastrointestinal: See HPI and otherwise negative Genitourinary: No dysuria or change in urinary frequency Neurological: No headache, dizziness or syncope Musculoskeletal: No new muscle or joint pain Hematologic: No bleeding or bruising    Physical Exam:  Vital signs: There were no vitals taken for this visit.  Constitutional: NAD, Well developed, Well nourished, alert and cooperative Head:  Normocephalic and atraumatic.  Eyes: No scleral icterus. Conjunctiva pink. Mouth: No oral lesions. Respiratory: Respirations even and unlabored. Lungs clear to auscultation bilaterally.  No wheezes, crackles, or rhonchi.  Cardiovascular:  Regular rate and rhythm. No murmurs. No peripheral edema. Gastrointestinal:  Soft, nondistended, nontender. No rebound or guarding. Normal bowel sounds. No appreciable masses or hepatomegaly. Rectal:  Not performed.  Neurologic:  Alert and oriented x4;  grossly normal neurologically.  Skin:   Dry and intact without significant lesions or rashes. Psychiatric: Oriented to person, place and time. Demonstrates good judgement and reason without abnormal affect or behaviors.   RELEVANT LABS AND IMAGING: CBC    Component Value Date/Time   WBC 11.5 (H) 06/13/2018 0349   RBC 4.16 (L) 06/13/2018 0349   HGB 12.6 (L) 06/13/2018 0349   HCT 38.2 (L) 06/13/2018 0349   PLT 129 (L) 06/13/2018 0349   MCV 91.8 06/13/2018 0349   MCH 30.3 06/13/2018 0349   MCHC 33.0 06/13/2018 0349   RDW  13.6 06/13/2018 0349   LYMPHSABS 1.0 06/01/2018 1034   MONOABS 0.7 06/01/2018 1034   EOSABS 0.1 06/01/2018 1034   BASOSABS 0.0 06/01/2018 1034    CMP     Component Value Date/Time   NA 135 06/13/2018 0349   K 4.3 06/13/2018 0349   CL 104 06/13/2018 0349   CO2 24 06/13/2018 0349   GLUCOSE 168 (H) 06/13/2018 0349   BUN 33 (H) 06/13/2018 0349   CREATININE 1.92 (H) 06/13/2018 0349   CREATININE 1.33 (H) 11/12/2015 1054   CALCIUM 8.6 (L) 06/13/2018 0349   PROT 6.7 06/01/2018 1034   ALBUMIN 4.0 06/01/2018 1034   AST 17 06/01/2018 1034   ALT 15 (L) 06/01/2018 1034   ALKPHOS 65 06/01/2018 1034   BILITOT 1.0 06/01/2018 1034   GFRNONAA 31 (L) 06/13/2018 0349   GFRAA 36 (L) 06/13/2018 0349     Assessment/Plan:       Camie Furbish, PA-C [facility] 09/18/2024, 7:37 PM  Patient Care  Team: Valentin Skates, DO as PCP - General (Internal Medicine)

## 2024-09-19 ENCOUNTER — Other Ambulatory Visit

## 2024-09-19 ENCOUNTER — Ambulatory Visit (INDEPENDENT_AMBULATORY_CARE_PROVIDER_SITE_OTHER): Admitting: Gastroenterology

## 2024-09-19 ENCOUNTER — Encounter: Payer: Self-pay | Admitting: Gastroenterology

## 2024-09-19 VITALS — BP 100/60 | HR 56 | Ht 71.0 in | Wt 183.6 lb

## 2024-09-19 DIAGNOSIS — Z7901 Long term (current) use of anticoagulants: Secondary | ICD-10-CM

## 2024-09-19 DIAGNOSIS — R748 Abnormal levels of other serum enzymes: Secondary | ICD-10-CM

## 2024-09-19 DIAGNOSIS — K760 Fatty (change of) liver, not elsewhere classified: Secondary | ICD-10-CM | POA: Diagnosis not present

## 2024-09-19 DIAGNOSIS — E78019 Familial hypercholesterolemia, unspecified: Secondary | ICD-10-CM

## 2024-09-19 DIAGNOSIS — R161 Splenomegaly, not elsewhere classified: Secondary | ICD-10-CM | POA: Diagnosis not present

## 2024-09-19 LAB — CBC WITH DIFFERENTIAL/PLATELET
Basophils Absolute: 0.1 K/uL (ref 0.0–0.1)
Basophils Relative: 1.3 % (ref 0.0–3.0)
Eosinophils Absolute: 0.2 K/uL (ref 0.0–0.7)
Eosinophils Relative: 4.1 % (ref 0.0–5.0)
HCT: 42.1 % (ref 39.0–52.0)
Hemoglobin: 13.3 g/dL (ref 13.0–17.0)
Lymphocytes Relative: 11.7 % — ABNORMAL LOW (ref 12.0–46.0)
Lymphs Abs: 0.6 K/uL — ABNORMAL LOW (ref 0.7–4.0)
MCHC: 31.6 g/dL (ref 30.0–36.0)
MCV: 92.8 fl (ref 78.0–100.0)
Monocytes Absolute: 0.5 K/uL (ref 0.1–1.0)
Monocytes Relative: 9.5 % (ref 3.0–12.0)
Neutro Abs: 3.7 K/uL (ref 1.4–7.7)
Neutrophils Relative %: 73.4 % (ref 43.0–77.0)
Platelets: 122 K/uL — ABNORMAL LOW (ref 150.0–400.0)
RBC: 4.54 Mil/uL (ref 4.22–5.81)
RDW: 19.4 % — ABNORMAL HIGH (ref 11.5–15.5)
WBC: 5 K/uL (ref 4.0–10.5)

## 2024-09-19 LAB — IBC + FERRITIN
Ferritin: 100 ng/mL (ref 22.0–322.0)
Iron: 61 ug/dL (ref 42–165)
Saturation Ratios: 24.5 % (ref 20.0–50.0)
TIBC: 249.2 ug/dL — ABNORMAL LOW (ref 250.0–450.0)
Transferrin: 178 mg/dL — ABNORMAL LOW (ref 212.0–360.0)

## 2024-09-19 LAB — TSH: TSH: 3.63 u[IU]/mL (ref 0.35–5.50)

## 2024-09-19 LAB — COMPREHENSIVE METABOLIC PANEL WITH GFR
ALT: 22 U/L (ref 0–53)
AST: 26 U/L (ref 0–37)
Albumin: 3.7 g/dL (ref 3.5–5.2)
Alkaline Phosphatase: 73 U/L (ref 39–117)
BUN: 23 mg/dL (ref 6–23)
CO2: 32 meq/L (ref 19–32)
Calcium: 9.1 mg/dL (ref 8.4–10.5)
Chloride: 100 meq/L (ref 96–112)
Creatinine, Ser: 1.63 mg/dL — ABNORMAL HIGH (ref 0.40–1.50)
GFR: 37.17 mL/min — ABNORMAL LOW (ref >60.00)
Glucose, Bld: 138 mg/dL — ABNORMAL HIGH (ref 70–99)
Potassium: 3.9 meq/L (ref 3.5–5.1)
Sodium: 141 meq/L (ref 135–145)
Total Bilirubin: 1 mg/dL (ref 0.2–1.2)
Total Protein: 6.4 g/dL (ref 6.0–8.3)

## 2024-09-19 LAB — PROTIME-INR
INR: 2 ratio — ABNORMAL HIGH (ref 0.8–1.0)
Prothrombin Time: 20.2 s — ABNORMAL HIGH (ref 9.6–13.1)

## 2024-09-19 NOTE — Patient Instructions (Signed)
 You will be contacted by [facility] Scheduling in the next 2 days to arrange a Abdominal Ultrasound The number on your caller ID will be [phone], please answer when they call.  If you have not heard from them in 2 days please call [phone] to schedule.    Your provider has requested that you go to the basement level for lab work before leaving today. Press B on the elevator. The lab is located at the first door on the left as you exit the elevator.   Follow-up in 3 months. Office will contact you to schedule at a later time.   Due to recent changes in healthcare laws, you may see the results of your imaging and laboratory studies on MyChart before your provider has had a chance to review them.  We understand that in some cases there may be results that are confusing or concerning to you. Not all laboratory results come back in the same time frame and the provider may be waiting for multiple results in order to interpret others.  Please give us  48 hours in order for your provider to thoroughly review all the results before contacting the office for clarification of your results.   _______________________________________________________  If your blood pressure at your visit was 140/90 or greater, please contact your primary care physician to follow up on this.  _______________________________________________________  If you are age 88 or older, your body mass index should be between 23-30. Your Body mass index is 25.61 kg/m. If this is out of the aforementioned range listed, please consider follow up with your Primary Care Provider.  If you are age 88 or younger, your body mass index should be between 19-25. Your Body mass index is 25.61 kg/m. If this is out of the aformentioned range listed, please consider follow up with your Primary Care Provider.   ________________________________________________________  The [facility] providers would like to encourage you to use MYCHART  to communicate with providers for non-urgent requests or questions.  Due to long hold times on the telephone, sending your provider a message by [facility] may be a faster and more efficient way to get a response.  Please allow 48 business hours for a response.  Please remember that this is for non-urgent requests.  _______________________________________________________  [facility] is using a team-based approach to care.  Your team is made up of your doctor and two to three APPS. Our APPS (Nurse Practitioners and Physician Assistants) work with your physician to ensure care continuity for you. They are fully qualified to address your health concerns and develop a treatment plan. They communicate directly with your gastroenterologist to care for you. Seeing the Advanced Practice Practitioners on your physician's team can help you by facilitating care more promptly, often allowing for earlier appointments, access to diagnostic testing, procedures, and other specialty referrals.   Thank you for choosing me and [facility].  Camie Furbish PA-C

## 2024-09-20 LAB — IGA: Immunoglobulin A: 395 mg/dL — ABNORMAL HIGH (ref 70–320)

## 2024-09-20 LAB — TISSUE TRANSGLUTAMINASE, IGA: (tTG) Ab, IgA: <1 U/mL

## 2024-09-20 LAB — IGG: IgG (Immunoglobin G), Serum: 1093 mg/dL (ref 600–1540)

## 2024-09-21 ENCOUNTER — Encounter: Payer: Self-pay | Admitting: Gastroenterology

## 2024-09-23 NOTE — Progress Notes (Signed)
 Attending Physician's Attestation   I have reviewed the chart.   I agree with the Advanced Practitioner's note, impression, and recommendations with any updates as below. I also recommend trying to get elastography completed with the abdominal ultrasound if possible. Would also recommend that if we find that he does have ascites, that we see if we can get a diagnostic paracentesis performed.  Evaluation of fluid total protein may help us  in regards to understanding whether this is cardiac or liver related ascites.  I do think that we need to have a better understanding of his underlying heart disease and so recent echocardiogram information and cardiology notes for review will be helpful as that could help better delineate for us  if he has a significantly low EF whether his likely development of more significant liver disease may be due to his heart.  Clinic follow-up in the next 4 to 6 weeks is reasonable.   Aloha Finner, MD [facility] Advanced Endoscopy Office # [phone]

## 2024-09-24 ENCOUNTER — Other Ambulatory Visit: Payer: Self-pay

## 2024-09-24 ENCOUNTER — Ambulatory Visit: Payer: Self-pay | Admitting: Gastroenterology

## 2024-09-24 DIAGNOSIS — K760 Fatty (change of) liver, not elsewhere classified: Secondary | ICD-10-CM

## 2024-09-24 DIAGNOSIS — R748 Abnormal levels of other serum enzymes: Secondary | ICD-10-CM

## 2024-09-24 DIAGNOSIS — Z7901 Long term (current) use of anticoagulants: Secondary | ICD-10-CM

## 2024-09-24 DIAGNOSIS — R161 Splenomegaly, not elsewhere classified: Secondary | ICD-10-CM

## 2024-09-24 NOTE — Telephone Encounter (Signed)
 See other encounter

## 2024-10-01 ENCOUNTER — Ambulatory Visit (HOSPITAL_COMMUNITY)
Admission: RE | Admit: 2024-10-01 | Discharge: 2024-10-01 | Disposition: A | Discharge status: Home / Self Care | Source: Ambulatory Visit | Attending: Gastroenterology | Admitting: Gastroenterology

## 2024-10-01 DIAGNOSIS — K760 Fatty (change of) liver, not elsewhere classified: Secondary | ICD-10-CM | POA: Insufficient documentation

## 2024-10-01 DIAGNOSIS — R748 Abnormal levels of other serum enzymes: Secondary | ICD-10-CM | POA: Insufficient documentation

## 2024-10-01 DIAGNOSIS — R161 Splenomegaly, not elsewhere classified: Secondary | ICD-10-CM | POA: Insufficient documentation

## 2024-10-04 ENCOUNTER — Telehealth: Payer: Self-pay | Admitting: Gastroenterology

## 2024-10-04 ENCOUNTER — Ambulatory Visit: Payer: Self-pay | Admitting: Gastroenterology

## 2024-10-04 DIAGNOSIS — R161 Splenomegaly, not elsewhere classified: Secondary | ICD-10-CM

## 2024-10-04 DIAGNOSIS — R748 Abnormal levels of other serum enzymes: Secondary | ICD-10-CM

## 2024-10-04 DIAGNOSIS — K7460 Unspecified cirrhosis of liver: Secondary | ICD-10-CM

## 2024-10-04 DIAGNOSIS — K760 Fatty (change of) liver, not elsewhere classified: Secondary | ICD-10-CM

## 2024-10-04 NOTE — Telephone Encounter (Signed)
 Please call patient/family and let them know that ultrasound shows a nodular contour of the liver which can be seen in cirrhosis.  No lesions or evidence of liver cancer.  There is enlargement of the spleen which can also be seen as a consequence of cirrhosis.  Minimal fluid around the liver/perihepatic ascites.  This would not be enough to warrant paracentesis.  Also has bilateral pleural effusions which, per referral note, were seen on previous imaging.  Has multicystic kidneys. Elastography portion of the exam is inconclusive but is suggestive of advanced liver disease/cirrhosis.  Dr. Wilhelmenia has reviewed his chart and imaging studies and has suggested we add patient to list for FibroScan when we have this available ([facility] has added him to the list). He also recommends a liver protocol CT abdomen to further evaluate for changes due to cirrhosis, and an AFP level. Standard of care is to screen for esophageal varices with an upper endoscopy (would need to be done in hospital setting). Up to the patient and family regarding how aggressive they want to be with workup. If they are concerned about him undergoing EGD this could be discussed at a follow up office visit.  He does need to follow-up with Dr. Wilhelmenia in the next 6 weeks if possible. Please discuss with family and let me know what they would like to do, thank you.

## 2024-10-05 NOTE — Telephone Encounter (Signed)
 Left message on machine to call back

## 2024-10-08 NOTE — Telephone Encounter (Signed)
 Spoke with pt nephew and discussed results.  He will bring the pt in for labs- order has been entered   CT scan order entered and agreed to complete.  Sent to the schedulers   EGD the pt and nephew prefer to discuss in the office prior to making appt.  F/U made for 12/9 at 3:50  pm    The pt will let us  know if there are any question or concerns in the meantime.

## 2024-10-08 NOTE — Telephone Encounter (Signed)
Thank you Patty. GM

## 2024-10-11 ENCOUNTER — Other Ambulatory Visit

## 2024-10-11 DIAGNOSIS — K760 Fatty (change of) liver, not elsewhere classified: Secondary | ICD-10-CM

## 2024-10-11 DIAGNOSIS — K7460 Unspecified cirrhosis of liver: Secondary | ICD-10-CM

## 2024-10-11 DIAGNOSIS — R748 Abnormal levels of other serum enzymes: Secondary | ICD-10-CM

## 2024-10-11 DIAGNOSIS — R161 Splenomegaly, not elsewhere classified: Secondary | ICD-10-CM

## 2024-10-15 ENCOUNTER — Encounter: Payer: Self-pay | Admitting: Radiology

## 2024-10-15 LAB — AFP TUMOR MARKER: AFP-Tumor Marker: 1.3 ng/mL (ref <6.1)

## 2024-10-19 ENCOUNTER — Ambulatory Visit: Payer: Self-pay | Admitting: Gastroenterology

## 2024-10-21 ENCOUNTER — Emergency Department (HOSPITAL_COMMUNITY)

## 2024-10-21 ENCOUNTER — Encounter (HOSPITAL_COMMUNITY): Payer: Self-pay

## 2024-10-21 ENCOUNTER — Other Ambulatory Visit: Payer: Self-pay

## 2024-10-21 ENCOUNTER — Inpatient Hospital Stay (HOSPITAL_COMMUNITY)
Admission: EM | Admit: 2024-10-21 | Discharge: 2024-10-24 | DRG: 064 | Disposition: A | Discharge status: Skilled Nursing Facility | Attending: Internal Medicine | Admitting: Internal Medicine

## 2024-10-21 DIAGNOSIS — I5A Non-ischemic myocardial injury (non-traumatic): Secondary | ICD-10-CM | POA: Diagnosis present

## 2024-10-21 DIAGNOSIS — R443 Hallucinations, unspecified: Secondary | ICD-10-CM | POA: Diagnosis not present

## 2024-10-21 DIAGNOSIS — K7200 Acute and subacute hepatic failure without coma: Secondary | ICD-10-CM | POA: Diagnosis present

## 2024-10-21 DIAGNOSIS — Z79899 Other long term (current) drug therapy: Secondary | ICD-10-CM

## 2024-10-21 DIAGNOSIS — R7989 Other specified abnormal findings of blood chemistry: Secondary | ICD-10-CM

## 2024-10-21 DIAGNOSIS — F05 Delirium due to known physiological condition: Secondary | ICD-10-CM | POA: Diagnosis not present

## 2024-10-21 DIAGNOSIS — E860 Dehydration: Secondary | ICD-10-CM | POA: Diagnosis present

## 2024-10-21 DIAGNOSIS — I509 Heart failure, unspecified: Secondary | ICD-10-CM | POA: Diagnosis not present

## 2024-10-21 DIAGNOSIS — R230 Cyanosis: Secondary | ICD-10-CM | POA: Diagnosis present

## 2024-10-21 DIAGNOSIS — R296 Repeated falls: Secondary | ICD-10-CM | POA: Diagnosis present

## 2024-10-21 DIAGNOSIS — Z87891 Personal history of nicotine dependence: Secondary | ICD-10-CM | POA: Diagnosis not present

## 2024-10-21 DIAGNOSIS — Z515 Encounter for palliative care: Secondary | ICD-10-CM

## 2024-10-21 DIAGNOSIS — I5022 Chronic systolic (congestive) heart failure: Secondary | ICD-10-CM | POA: Diagnosis present

## 2024-10-21 DIAGNOSIS — I6389 Other cerebral infarction: Secondary | ICD-10-CM | POA: Diagnosis present

## 2024-10-21 DIAGNOSIS — J9811 Atelectasis: Secondary | ICD-10-CM | POA: Diagnosis present

## 2024-10-21 DIAGNOSIS — Z7901 Long term (current) use of anticoagulants: Secondary | ICD-10-CM | POA: Diagnosis not present

## 2024-10-21 DIAGNOSIS — M1990 Unspecified osteoarthritis, unspecified site: Secondary | ICD-10-CM | POA: Diagnosis present

## 2024-10-21 DIAGNOSIS — N179 Acute kidney failure, unspecified: Principal | ICD-10-CM | POA: Diagnosis present

## 2024-10-21 DIAGNOSIS — E861 Hypovolemia: Secondary | ICD-10-CM | POA: Diagnosis present

## 2024-10-21 DIAGNOSIS — L89156 Pressure-induced deep tissue damage of sacral region: Secondary | ICD-10-CM | POA: Diagnosis present

## 2024-10-21 DIAGNOSIS — I444 Left anterior fascicular block: Secondary | ICD-10-CM | POA: Diagnosis present

## 2024-10-21 DIAGNOSIS — G9341 Metabolic encephalopathy: Secondary | ICD-10-CM | POA: Diagnosis present

## 2024-10-21 DIAGNOSIS — I959 Hypotension, unspecified: Secondary | ICD-10-CM | POA: Diagnosis present

## 2024-10-21 DIAGNOSIS — Z96653 Presence of artificial knee joint, bilateral: Secondary | ICD-10-CM | POA: Diagnosis present

## 2024-10-21 DIAGNOSIS — W19XXXA Unspecified fall, initial encounter: Principal | ICD-10-CM | POA: Diagnosis present

## 2024-10-21 DIAGNOSIS — I5020 Unspecified systolic (congestive) heart failure: Secondary | ICD-10-CM | POA: Diagnosis not present

## 2024-10-21 DIAGNOSIS — Z7984 Long term (current) use of oral hypoglycemic drugs: Secondary | ICD-10-CM

## 2024-10-21 DIAGNOSIS — Z66 Do not resuscitate: Secondary | ICD-10-CM | POA: Diagnosis present

## 2024-10-21 DIAGNOSIS — I2489 Other forms of acute ischemic heart disease: Secondary | ICD-10-CM | POA: Diagnosis present

## 2024-10-21 DIAGNOSIS — Z888 Allergy status to other drugs, medicaments and biological substances status: Secondary | ICD-10-CM

## 2024-10-21 DIAGNOSIS — I6381 Other cerebral infarction due to occlusion or stenosis of small artery: Secondary | ICD-10-CM | POA: Diagnosis present

## 2024-10-21 DIAGNOSIS — E8721 Acute metabolic acidosis: Secondary | ICD-10-CM | POA: Diagnosis present

## 2024-10-21 DIAGNOSIS — R0902 Hypoxemia: Secondary | ICD-10-CM | POA: Diagnosis present

## 2024-10-21 DIAGNOSIS — N1832 Chronic kidney disease, stage 3b: Secondary | ICD-10-CM | POA: Diagnosis present

## 2024-10-21 DIAGNOSIS — I130 Hypertensive heart and chronic kidney disease with heart failure and stage 1 through stage 4 chronic kidney disease, or unspecified chronic kidney disease: Secondary | ICD-10-CM | POA: Diagnosis present

## 2024-10-21 DIAGNOSIS — Z8419 Family history of other disorders of kidney and ureter: Secondary | ICD-10-CM

## 2024-10-21 DIAGNOSIS — F32A Depression, unspecified: Secondary | ICD-10-CM | POA: Diagnosis present

## 2024-10-21 DIAGNOSIS — Z8043 Family history of malignant neoplasm of testis: Secondary | ICD-10-CM

## 2024-10-21 DIAGNOSIS — I639 Cerebral infarction, unspecified: Secondary | ICD-10-CM | POA: Diagnosis not present

## 2024-10-21 DIAGNOSIS — N3000 Acute cystitis without hematuria: Secondary | ICD-10-CM | POA: Diagnosis not present

## 2024-10-21 DIAGNOSIS — I480 Paroxysmal atrial fibrillation: Secondary | ICD-10-CM | POA: Diagnosis present

## 2024-10-21 DIAGNOSIS — Z8042 Family history of malignant neoplasm of prostate: Secondary | ICD-10-CM

## 2024-10-21 DIAGNOSIS — Z887 Allergy status to serum and vaccine status: Secondary | ICD-10-CM

## 2024-10-21 LAB — CBC WITH DIFFERENTIAL/PLATELET
Abs Immature Granulocytes: 0.2 K/uL — ABNORMAL HIGH (ref 0.00–0.07)
Basophils Absolute: 0 K/uL (ref 0.0–0.1)
Basophils Relative: 0 %
Eosinophils Absolute: 0 K/uL (ref 0.0–0.5)
Eosinophils Relative: 0 %
HCT: 44.4 % (ref 39.0–52.0)
Hemoglobin: 14.2 g/dL (ref 13.0–17.0)
Immature Granulocytes: 2 %
Lymphocytes Relative: 2 %
Lymphs Abs: 0.3 K/uL — ABNORMAL LOW (ref 0.7–4.0)
MCH: 30.1 pg (ref 26.0–34.0)
MCHC: 32 g/dL (ref 30.0–36.0)
MCV: 94.3 fL (ref 80.0–100.0)
Monocytes Absolute: 0.7 K/uL (ref 0.1–1.0)
Monocytes Relative: 5 %
Neutro Abs: 12.5 K/uL — ABNORMAL HIGH (ref 1.7–7.7)
Neutrophils Relative %: 91 %
Platelets: 109 K/uL — ABNORMAL LOW (ref 150–400)
RBC: 4.71 MIL/uL (ref 4.22–5.81)
RDW: 17 % — ABNORMAL HIGH (ref 11.5–15.5)
Smear Review: NORMAL
WBC: 13.6 K/uL — ABNORMAL HIGH (ref 4.0–10.5)
nRBC: 0 % (ref 0.0–0.2)

## 2024-10-21 LAB — URINALYSIS, W/ REFLEX TO CULTURE (INFECTION SUSPECTED)
Bilirubin Urine: NEGATIVE
Glucose, UA: >=500 mg/dL — AB
Hgb urine dipstick: NEGATIVE
Ketones, ur: NEGATIVE mg/dL
Leukocytes,Ua: NEGATIVE
Nitrite: NEGATIVE
Protein, ur: 30 mg/dL — AB
Specific Gravity, Urine: 1.016 (ref 1.005–1.030)
pH: 5 (ref 5.0–8.0)

## 2024-10-21 LAB — I-STAT CHEM 8, ED
BUN: 40 mg/dL — ABNORMAL HIGH (ref 8–23)
Calcium, Ion: 1.14 mmol/L — ABNORMAL LOW (ref 1.15–1.40)
Chloride: 98 mmol/L (ref 98–111)
Creatinine, Ser: 2.5 mg/dL — ABNORMAL HIGH (ref 0.61–1.24)
Glucose, Bld: 135 mg/dL — ABNORMAL HIGH (ref 70–99)
HCT: 45 % (ref 39.0–52.0)
Hemoglobin: 15.3 g/dL (ref 13.0–17.0)
Potassium: 4.5 mmol/L (ref 3.5–5.1)
Sodium: 134 mmol/L — ABNORMAL LOW (ref 135–145)
TCO2: 26 mmol/L (ref 22–32)

## 2024-10-21 LAB — AMMONIA: Ammonia: <13 umol/L (ref 9–35)

## 2024-10-21 LAB — CK: Total CK: 88 U/L (ref 49–397)

## 2024-10-21 LAB — COMPREHENSIVE METABOLIC PANEL WITH GFR
ALT: 89 U/L — ABNORMAL HIGH (ref 0–44)
AST: 147 U/L — ABNORMAL HIGH (ref 15–41)
Albumin: 3.1 g/dL — ABNORMAL LOW (ref 3.5–5.0)
Alkaline Phosphatase: 89 U/L (ref 38–126)
Anion gap: 15 (ref 5–15)
BUN: 40 mg/dL — ABNORMAL HIGH (ref 8–23)
CO2: 24 mmol/L (ref 22–32)
Calcium: 9.2 mg/dL (ref 8.9–10.3)
Chloride: 98 mmol/L (ref 98–111)
Creatinine, Ser: 2.54 mg/dL — ABNORMAL HIGH (ref 0.61–1.24)
GFR, Estimated: 24 mL/min — ABNORMAL LOW (ref >60)
Glucose, Bld: 145 mg/dL — ABNORMAL HIGH (ref 70–99)
Potassium: 4.5 mmol/L (ref 3.5–5.1)
Sodium: 137 mmol/L (ref 135–145)
Total Bilirubin: 2.7 mg/dL — ABNORMAL HIGH (ref 0.0–1.2)
Total Protein: 6.6 g/dL (ref 6.5–8.1)

## 2024-10-21 LAB — I-STAT CG4 LACTIC ACID, ED
Lactic Acid, Venous: 3.3 mmol/L (ref 0.5–1.9)
Lactic Acid, Venous: 2.8 mmol/L (ref 0.5–1.9)

## 2024-10-21 LAB — BRAIN NATRIURETIC PEPTIDE: B Natriuretic Peptide: 1173.4 pg/mL — ABNORMAL HIGH (ref 0.0–100.0)

## 2024-10-21 LAB — TYPE AND SCREEN
ABO/RH(D): A POS
Antibody Screen: NEGATIVE

## 2024-10-21 LAB — TROPONIN I (HIGH SENSITIVITY)
Troponin I (High Sensitivity): 133 ng/L (ref <18)
Troponin I (High Sensitivity): 144 ng/L (ref <18)

## 2024-10-21 MED ORDER — FUROSEMIDE 10 MG/ML IJ SOLN
40.0000 mg | Freq: Every day | INTRAMUSCULAR | Status: DC
  Administered 2024-10-21: 40 mg via INTRAVENOUS
  Filled 2024-10-21: qty 4

## 2024-10-21 MED ORDER — SERTRALINE HCL 25 MG PO TABS
25.0000 mg | ORAL_TABLET | Freq: Every day | ORAL | Status: DC
  Administered 2024-10-21 – 2024-10-24 (×4): 25 mg via ORAL
  Filled 2024-10-21 (×4): qty 1

## 2024-10-21 MED ORDER — ENSURE PLUS HIGH PROTEIN PO LIQD
237.0000 mL | Freq: Two times a day (BID) | ORAL | Status: DC
  Administered 2024-10-22 – 2024-10-23 (×4): 237 mL via ORAL

## 2024-10-21 MED ORDER — SODIUM CHLORIDE 0.9 % IV SOLN
1.0000 g | INTRAVENOUS | Status: DC
  Administered 2024-10-21 – 2024-10-22 (×2): 1 g via INTRAVENOUS
  Filled 2024-10-21 (×2): qty 10

## 2024-10-21 MED ORDER — LOSARTAN POTASSIUM 25 MG PO TABS: 12.5000 mg | ORAL_TABLET | Freq: Every day | ORAL | Status: DC

## 2024-10-21 MED ORDER — APIXABAN 2.5 MG PO TABS
2.5000 mg | ORAL_TABLET | Freq: Two times a day (BID) | ORAL | Status: DC
  Administered 2024-10-21 – 2024-10-24 (×5): 2.5 mg via ORAL
  Filled 2024-10-21 (×6): qty 1

## 2024-10-21 NOTE — ED Triage Notes (Signed)
 Pt to ED via [facility] from home. Pt fell last night. Pt's brother saw him in his recliner last night and then when he returned this morning, pt was laying on the ground in front of recliner. Pt c/o left groin area when he sat up. Pt did say he hit his head, no visual marks on head. Pt does take eliquis . Pt was cyanotic on EMS arrival and improved when he sat up. Pupils are unequal.   EMS VS 100% RR 28 ETCO2 >25 HR 90 afib Cbg 176

## 2024-10-21 NOTE — ED Notes (Signed)
 Lactic results to kaitlyn m.rn by at

## 2024-10-21 NOTE — H&P (Signed)
 History and Physical    Patient: Craig Mann FMW:[id number] DOB: Aug 21, 1933 DOA: 10/21/2024 DOS: the patient was seen and examined on 10/21/2024 PCP: Valentin Skates, DO  Patient coming from: Home  Chief Complaint:  Chief Complaint  Patient presents with   Fall   HPI: Craig Mann is a 89 y.o. male with medical history significant of Afib, HTN, and OA p/w acute encephalopathy 2/2 presumed UTI iso GLF c/b AKI and presumed volume overload.  Pt is a limited historian. From what I can gather, pt was left in his recliner by his family the previous day and found down on the floor this morning. Pt was down for unknown time. Pt unable to detail etiology of his fall.   In the ED, pt tachypneic on RA. Labs notable for  Cr 2.5 (baseline 1.4-1.6), BNP 1173, WBC 13.6, and lactic acid 3.3-->2.8. EDP consulted medicine for admission.   Review of Systems: As mentioned in the history of present illness. All other systems reviewed and are negative. Past Medical History:  Diagnosis Date   A-fib [facility])    Arthritis    Hypertension    Macular degeneration of left eye    Primary localized osteoarthritis of right knee    Past Surgical History:  Procedure Laterality Date   ATRIAL FIBRILLATION ABLATION  03/2016   FOOT ARTHRODESIS, MODIFIED MCBRIDE Right 05/1983   HIP SURGERY Left 04/1999 AND 10/1994   REPLACEMENT X2   INGUINAL HERNIA REPAIR Left 07/2012   DOUBLE HERNIA on the left side   INGUINAL HERNIA REPAIR Right 05/1997   JOINT REPLACEMENT     KNEE CARTILAGE SURGERY Left 04/1998   CARTILAGE   NOSE SURGERY  1959   ROTATOR CUFF REPAIR Right 07/2002   SHOULDER   TOTAL KNEE ARTHROPLASTY Left 10/1999 and 1969   TIMES 04/8035,8037   TOTAL KNEE ARTHROPLASTY Right 06/12/2018   Procedure: RIGHT TOTAL KNEE ARTHROPLASTY;  Surgeon: Jerri Kay HERO, MD;  Location: [facility] OR;  Service: Orthopedics;  Laterality: Right;   Social History:  reports that he has quit smoking. He has quit using smokeless tobacco. He  reports that he does not currently use alcohol . He reports that he does not use drugs.  Allergies  Allergen Reactions   Hydrochlorothiazide      Other reaction(s): Headache, Drowsy, Headache, Drowsy   Influenza Vaccines     Other reaction(s): Fever   Lisinopril     Other reaction(s): Cough    Family History  Problem Relation Age of Onset   Prostate cancer Brother    Testicular cancer Brother    Kidney failure Brother     Prior to Admission medications   Medication Sig Start Date End Date Taking? Authorizing Provider  allopurinol (ZYLOPRIM) 100 MG tablet allopurinol 100 mg tablet  TAKE 1 TABLET BY MOUTH TWICE DAILY 11/14/19   [provider]  apixaban  (ELIQUIS ) 2.5 MG TABS tablet Take 2.5 mg by mouth 2 (two) times daily. 12/22/23   [provider]  Cholecalciferol 250 MCG (10000 UT) CAPS Take 1 capsule by mouth daily.    [provider]  cyanocobalamin (VITAMIN B12) 500 MCG tablet Take 500 mcg by mouth daily.    [provider]  empagliflozin (JARDIANCE) 25 MG TABS tablet Take 12.5 mg by mouth daily. 05/17/24   [provider]  furosemide (LASIX) 20 MG tablet Take 20 mg by mouth daily. 06/01/24   [provider]  losartan (COZAAR) 25 MG tablet Take 12.5 mg by mouth daily. 05/17/24  [provider]  metoprolol  tartrate (LOPRESSOR ) 50 MG tablet Take 50 mg by mouth 2 (two) times daily. With breakfast & supper    [provider]  Multiple Vitamins-Minerals (OCUVITE-LUTEIN PO) Take 1 tablet by mouth 2 (two) times daily.    [provider]  Multiple Vitamins-Minerals (PRESERVISION AREDS 2 PO) Take 1 tablet by mouth 2 (two) times daily.    [provider]  Polyethyl Glycol-Propyl Glycol (SYSTANE) 0.4-0.3 % SOLN Place 1 drop into both eyes 2 (two) times daily. SYSTANE    [provider]  sertraline (ZOLOFT) 50 MG tablet Take 25 mg by mouth daily. 07/31/24   [provider]    Physical  Exam: Vitals:   10/21/24 1111 10/21/24 1430 10/21/24 1454 10/21/24 1455  BP:   129/70   Pulse:  66 78   Resp:  20 20   Temp:    98.3 F (36.8 C)  TempSrc:      SpO2: 98% 99% 100%   Weight:      Height:       General: Alert, oriented x3, resting comfortably in no acute distress Respiratory: Lungs clear to auscultation bilaterally with normal respiratory effort; no w/r/r Cardiovascular: Regular rate and rhythm w/o m/r/g MSK: BLE pitting edema to calves   Data Reviewed: {Tip this will not be part of the note when signed- Document your independent interpretation of telemetry tracing, EKG, lab, Radiology test or any other diagnostic tests. Add any new diagnostic test ordered today. (Optional):[id number]} Lab Results  Component Value Date   WBC 13.6 (H) 10/21/2024   HGB 15.3 10/21/2024   HCT 45.0 10/21/2024   MCV 94.3 10/21/2024   PLT 109 (L) 10/21/2024   Lab Results  Component Value Date   GLUCOSE 135 (H) 10/21/2024   CALCIUM 9.2 10/21/2024   NA 134 (L) 10/21/2024   K 4.5 10/21/2024   CO2 24 10/21/2024   CL 98 10/21/2024   BUN 40 (H) 10/21/2024   CREATININE 2.50 (H) 10/21/2024   Lab Results  Component Value Date   ALT 89 (H) 10/21/2024   AST 147 (H) 10/21/2024   ALKPHOS 89 10/21/2024   BILITOT 2.7 (H) 10/21/2024   Lab Results  Component Value Date   INR 2.0 (H) 09/19/2024   INR 1.10 06/01/2018   Radiology: CT CHEST ABDOMEN PELVIS WO CONTRAST Result Date: 10/21/2024 EXAM: CT CHEST, ABDOMEN AND PELVIS WITHOUT CONTRAST 10/21/2024 01:11:47 PM TECHNIQUE: CT of the chest, abdomen and pelvis was performed without the administration of intravenous contrast. Multiplanar reformatted images are provided for review. Automated exposure control, iterative reconstruction, and/or weight based adjustment of the mA/kV was utilized to reduce the radiation dose to as low as reasonably achievable. COMPARISON: Comparison with same day radiographs. CLINICAL HISTORY: Polytrauma, blunt. FINDINGS:  CHEST: MEDIASTINUM AND LYMPH NODES: Cardiomegaly. Coronary artery and aortic atherosclerotic calcifications. Assessment for vascular injury is limited without IV contrast. No pericardial effusion. The central airways are clear. No mediastinal, hilar or axillary lymphadenopathy. LUNGS AND PLEURA: Moderate bilateral pleural effusions and compressive atelectasis. Bronchial wall thickening and mucus plugging in the lower lobes. No pneumothorax. ABDOMEN AND PELVIS: LIVER: The liver is unremarkable. GALLBLADDER AND BILE DUCTS: Gallbladder wall thickening favored due to volume status. No biliary ductal dilatation. SPLEEN: No acute abnormality. PANCREAS: No acute abnormality. ADRENAL GLANDS: No acute abnormality. KIDNEYS, URETERS AND BLADDER: Polycystic kidneys. No stones in the kidneys or ureters. No hydronephrosis. No perinephric or periureteral stranding. The bladder is obscured by streak artifact. GI AND BOWEL:  Stomach demonstrates no acute abnormality. There is no bowel obstruction. REPRODUCTIVE ORGANS: No acute abnormality. PERITONEUM AND RETROPERITONEUM: Mesenteric and body wall edema. No ascites. No free air. VASCULATURE: Aorta is normal in caliber. ABDOMINAL AND PELVIS LYMPH NODES: Shotty retroperitoneal lymph nodes. BONES AND SOFT TISSUES: Bilateral hip arthroplasties. No acute osseous abnormality. No focal soft tissue abnormality. IMPRESSION: 1. Moderate bilateral pleural effusions with compressive atelectasis. 2. Lower lobe bronchitis. 3. Mesenteric and body wall edema, likely related to volume overload. 4. Gallbladder wall thickening favored secondary to volume status. Electronically signed by: Norman Gatlin MD 10/21/2024 02:32 PM EST RP Workstation: [id number]   DG Lumbar Spine Complete Result Date: 10/21/2024 EXAM: 4 VIEW(S) XRAY OF THE LUMBAR SPINE 10/21/2024 12:03:00 PM COMPARISON: None available. CLINICAL HISTORY: fall FINDINGS: LUMBAR SPINE: BONES: Mild dextrocurvature of the lumbar spine. There are  presumed to be 5 non rib bearing lumbar type vertebral bodies. No radiographic evidence of fracture. Alignment is maintained. No aggressive appearing osseous lesion. DISCS AND DEGENERATIVE CHANGES: No radiographic evidence of pars defect. Disc space narrowing at multiple levels throughout the visualized spine. There is moderate disc space narrowing at L3-L4. Additional mild to moderate disc space narrowing at L2-L3. Degenerative endplate osteophytes at multiple levels. Facet arthrosis most pronounced at L4-L5 and L5-S1. SOFT TISSUES: No acute abnormality. IMPRESSION: 1. No acute abnormality of the lumbar spine related to the fall. 2. Degenerative changes as above. Moderate disc space narrowing at L3-4. Electronically signed by: Donnice Mania MD 10/21/2024 12:26 PM EST RP Workstation: [id number]   DG Pelvis Portable Result Date: 10/21/2024 EXAM: 1 or 2 VIEW(S) XRAY OF THE PELVIS 10/21/2024 12:03:00 PM COMPARISON: None available. 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 Degenerative changes of the visualized lower lumbar spine. SOFT TISSUES: Vascular calcification. IMPRESSION: 1. No acute findings. 2. 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 3. Degenerative changes of the visualized lower lumbar spine. Electronically signed by: Donnice Mania MD 10/21/2024 12:24 PM EST RP Workstation: [id number]   DG Chest Portable 1 View Result Date: 10/21/2024 EXAM: 1 VIEW(S) XRAY OF THE CHEST 10/21/2024 12:03:00 PM COMPARISON: 06/01/2018 CLINICAL HISTORY: fall FINDINGS: LUNGS AND PLEURA: Retrocardiac airspace opacity. Trace bilateral pleural effusions. Slightly prominent interstitial markings. No pulmonary edema. No pneumothorax. HEART AND MEDIASTINUM: Cardiomegaly. Atherosclerotic plaque noted. BONES AND SOFT TISSUES: No acute osseous abnormality. IMPRESSION: 1. Retrocardiac airspace opacity, consider two view chest xray or CT for further evaluation. 2. Prominent  interstitial markings suggestive of mild pulmonary edema. 3. Trace bilateral pleural effusions. Electronically signed by: Donnice Mania MD 10/21/2024 12:21 PM EST RP Workstation: [id number]   CT Cervical Spine Wo Contrast Result Date: 10/21/2024 EXAM: CT CERVICAL SPINE WITHOUT CONTRAST 10/21/2024 11:57:06 AM TECHNIQUE: CT of the cervical spine was performed without the administration of intravenous contrast. Multiplanar reformatted images are provided for review. Automated exposure control, iterative reconstruction, and/or weight based adjustment of the mA/kV was utilized to reduce the radiation dose to as low as reasonably achievable. COMPARISON: None available. CLINICAL HISTORY: Neck trauma (Age >= 65y) FINDINGS: CERVICAL SPINE: BONES AND ALIGNMENT: Straightening of the normal cervical lordosis. No evidence of traumatic malalignment. Schmorl node involving the superior endplate of C7 vertebral body. Vertebral body heights otherwise maintained. Partial fusion of the C2 and C3 vertebral bodies and the C5 and C6 vertebral bodies. Multiple scattered lucent foci in the cervical spine. Consider nonemergent correlation with contrast enhanced MRI of the cervical spine. DEGENERATIVE CHANGES: Asymmetric degenerative changes of the right atlantoaxial articulation. Disc space  narrowing throughout the cervical spine greatest at C3-C4 and C5-C6. Disc osteophyte complexes at multiple levels. No high grade osseous spinal canal stenosis. Facet arthrosis and uncovertebral hypertrophy at multiple levels. SOFT TISSUES: No prevertebral soft tissue swelling. LUNGS/PLEURA: Partially visualized right pleural effusion. IMPRESSION: 1. No acute abnormality of the cervical spine related to the reported neck trauma. 2. Multiple scattered lucent foci in the cervical spine. Recommend non-emergent contrast-enhanced MRI of the cervical spine for further evaluation. 3. Degenerative changes as above. 4. Partially visualized right pleural  effusion. Electronically signed by: Donnice Mania MD 10/21/2024 12:19 PM EST RP Workstation: [id number]   CT Head Wo Contrast Result Date: 10/21/2024 EXAM: CT HEAD WITHOUT CONTRAST 10/21/2024 11:57:06 AM TECHNIQUE: CT of the head was performed without the administration of intravenous contrast. Automated exposure control, iterative reconstruction, and/or weight based adjustment of the mA/kV was utilized to reduce the radiation dose to as low as reasonably achievable. COMPARISON: None available. CLINICAL HISTORY: Head trauma, minor (Age >= 65y). FINDINGS: BRAIN AND VENTRICLES: No acute hemorrhage. No evidence of acute infarct. Proportional prominence of ventricles and sulci, consistent with diffuse cerebral parenchymal volume loss. Periventricular and subcortical white matter hypoattenuation, consistent with moderate chronic ischemic microvascular disease. Remote lacunar infarct in the right basal ganglia. No hydrocephalus. No extra-axial collection. No mass effect or midline shift. ORBITS: Left lens replacement. SINUSES: No acute abnormality. SOFT TISSUES AND SKULL: No acute soft tissue abnormality. No skull fracture. VASCULATURE: Calcified atherosclerotic plaque within cavernous/supraclinoid ICA and intradural vertebral arteries. IMPRESSION: 1. No acute intracranial abnormality related to the head trauma. 2. Diffuse cerebral parenchymal volume loss. 3. Moderate chronic ischemic microvascular disease. 4. Remote lacunar infarct in the right basal ganglia. Electronically signed by: Donnice Mania MD 10/21/2024 12:12 PM EST RP Workstation: [id number]    Assessment and Plan: 89M h/o Afib, HTN, and OA p/w acute encephalopathy 2/2 presumed UTI iso GLF c/b AKI and presumed volume overload.  AKI -IV diuresis per above -Strict I&Os and daily weights (standing preferred) -F/u PVR to r/o post-renal obstruction -F/u BMP daily -Renally dose medications for CrCl -Avoid lovenox, NSAIDs, morphine, Fleet's phosphate  enema, regular insulin, contrast; no gadolinium for MRI to avoid nephrogenic systemic fibrosis -Consider renal US  and nephrology consult if worsening AKI  Acute encephalopathy Abnl UA Presumed acute cystitis UA w/ bacteruria -IV CTX 1g daily for now -F/u urine and blood cultures  Elevated BNP BLE edema Possible new HFpEF -IV lasix 40mg  daily for now; goal net neg 1-2L/d; strict I/Os; daily standing weights; K>4/Mg>2 -HOLD PTA losartan iso AKI -HOLD pta metoprolol   NSTEM, type 2 -Defer cards eval for now given no cp -PTA apixaban  2.5mg  BID for now  Afib -PTA apixaban  2.5mg  BID -HOLD pta metoprolol  per above for now  Physical deconditioning -PT/OT consulted; apprec eval/recs   Advance Care Planning:   Code Status: Full Code   Consults: N/A  Family Communication: N/A  Severity of Illness: The appropriate patient status for this patient is INPATIENT. Inpatient status is judged to be reasonable and necessary in order to provide the required intensity of service to ensure the patient's safety. The patient's presenting symptoms, physical exam findings, and initial radiographic and laboratory data in the context of their chronic comorbidities is felt to place them at high risk for further clinical deterioration. Furthermore, it is not anticipated that the patient will be medically stable for discharge from the hospital within 2 midnights of admission.   * I certify that at the point of admission it is my  clinical judgment that the patient will require inpatient hospital care spanning beyond 2 midnights from the point of admission due to high intensity of service, high risk for further deterioration and high frequency of surveillance required.*   ------- I spent 55 minutes reviewing previous notes, at the bedside counseling/discussing the treatment plan, and performing clinical documentation.  Author: Marsha Ada, MD 10/21/2024 3:15 PM  For on call review [email or web address].

## 2024-10-21 NOTE — ED Notes (Signed)
 Patient urinated with help and in a urinal. He was cleaned up and changed.

## 2024-10-21 NOTE — Plan of Care (Signed)

## 2024-10-21 NOTE — ED Provider Notes (Signed)
 Patient found on the [facility] Provider Note   CSN: [id number] Arrival date & time: 10/21/24  1034     Patient presents with: Craig Mann is a 89 y.o. male.   89 year old male with prior medical history as detailed below presents for evaluation.  Patient's father last saw him yesterday evening when he was in his recliner.  Patient was found on the floor near his recliner this morning.  It is unclear how long he was on the floor.  Patient has no recollection of a fall.  Patient is on Eliquis .  EMS reports that the patient was quite cyanotic and hypoxic when laying flat.  Once he sat up his color returned to normal.  Patient reports that his breathing felt better when sitting upright.  Patient is confused as to recent events.  Family reports that this is not his normal level of awareness.  Patient denies current pain or discomfort.  The history is provided by the patient and medical records.       Prior to Admission medications   Medication Sig Start Date End Date Taking? Authorizing Provider  allopurinol (ZYLOPRIM) 100 MG tablet allopurinol 100 mg tablet  TAKE 1 TABLET BY MOUTH TWICE DAILY 11/14/19   [provider]  apixaban  (ELIQUIS ) 2.5 MG TABS tablet Take 2.5 mg by mouth 2 (two) times daily. 12/22/23   [provider]  Cholecalciferol 250 MCG (10000 UT) CAPS Take 1 capsule by mouth daily.    [provider]  cyanocobalamin (VITAMIN B12) 500 MCG tablet Take 500 mcg by mouth daily.    [provider]  empagliflozin (JARDIANCE) 25 MG TABS tablet Take 12.5 mg by mouth daily. 05/17/24   [provider]  furosemide (LASIX) 20 MG tablet Take 20 mg by mouth daily. 06/01/24   [provider]  losartan (COZAAR) 25 MG tablet Take 12.5 mg by mouth daily. 05/17/24   [provider]  metoprolol  tartrate (LOPRESSOR ) 50 MG tablet Take 50 mg by mouth 2 (two) times daily. With  breakfast & supper    [provider]  Multiple Vitamins-Minerals (OCUVITE-LUTEIN PO) Take 1 tablet by mouth 2 (two) times daily.    [provider]  Multiple Vitamins-Minerals (PRESERVISION AREDS 2 PO) Take 1 tablet by mouth 2 (two) times daily.    [provider]  Polyethyl Glycol-Propyl Glycol (SYSTANE) 0.4-0.3 % SOLN Place 1 drop into both eyes 2 (two) times daily. SYSTANE    [provider]  sertraline (ZOLOFT) 50 MG tablet Take 25 mg by mouth daily. 07/31/24   [provider]    Allergies: Hydrochlorothiazide , Influenza vaccines, and Lisinopril    Review of Systems  All other systems reviewed and are negative.   Updated Vital Signs BP 115/75 (BP Location: Right Arm)   Pulse 84   Temp 98.1 F (36.7 C) (Oral)   Resp (!) 22   Ht 5' 11 (1.803 m)   Wt 83 kg   SpO2 98%   BMI 25.52 kg/m   Physical Exam Vitals and nursing note reviewed.  Constitutional:      General: He is not in acute distress.    Appearance: Normal appearance. He is well-developed.  HENT:     Head: Normocephalic and atraumatic.  Eyes:     Conjunctiva/sclera: Conjunctivae normal.     Pupils: Pupils are equal, round, and reactive to light.  Cardiovascular:     Rate and Rhythm: Normal rate. Rhythm  irregular.     Heart sounds: Normal heart sounds.  Pulmonary:     Effort: Pulmonary effort is normal. No respiratory distress.     Comments: Decreased breath sounds at bilateral bases. Abdominal:     General: There is no distension.     Palpations: Abdomen is soft.     Tenderness: There is no abdominal tenderness.  Musculoskeletal:        General: No deformity. Normal range of motion.     Cervical back: Normal range of motion and neck supple.  Skin:    General: Skin is warm and dry.  Neurological:     General: No focal deficit present.     Mental Status: He is alert.     Comments: Oriented to person and place, patient is unable to recall recent events.  He is  unable to recall exactly how he did with the floor last night.     (all labs ordered are listed, but only abnormal results are displayed) Labs Reviewed  COMPREHENSIVE METABOLIC PANEL WITH GFR - Abnormal; Notable for the following components:      Result Value   Glucose, Bld 145 (*)    BUN 40 (*)    Creatinine, Ser 2.54 (*)    Albumin 3.1 (*)    AST 147 (*)    ALT 89 (*)    Total Bilirubin 2.7 (*)    GFR, Estimated 24 (*)    All other components within normal limits  CBC WITH DIFFERENTIAL/PLATELET - Abnormal; Notable for the following components:   WBC 13.6 (*)    RDW 17.0 (*)    Platelets 109 (*)    Neutro Abs 12.5 (*)    Lymphs Abs 0.3 (*)    Abs Immature Granulocytes 0.20 (*)    All other components within normal limits  BRAIN NATRIURETIC PEPTIDE - Abnormal; Notable for the following components:   B Natriuretic Peptide 1,173.4 (*)    All other components within normal limits  I-STAT CHEM 8, ED - Abnormal; Notable for the following components:   Sodium 134 (*)    BUN 40 (*)    Creatinine, Ser 2.50 (*)    Glucose, Bld 135 (*)    Calcium, Ion 1.14 (*)    All other components within normal limits  I-STAT CG4 LACTIC ACID, ED - Abnormal; Notable for the following components:   Lactic Acid, Venous 3.3 (*)    All other components within normal limits  TROPONIN I (HIGH SENSITIVITY) - Abnormal; Notable for the following components:   Troponin I (High Sensitivity) 133 (*)    All other components within normal limits  CK  URINALYSIS, W/ REFLEX TO CULTURE (INFECTION SUSPECTED)  AMMONIA  I-STAT CG4 LACTIC ACID, ED  TYPE AND SCREEN  TROPONIN I (HIGH SENSITIVITY)    EKG: EKG Interpretation Date/Time:  Sunday October 21 2024 10:43:29 EST Ventricular Rate:  87 PR Interval:    QRS Duration:  117 QT Interval:  389 QTC Calculation: 468 R Axis:   -49  Text Interpretation: Atrial fibrillation Ventricular premature complex Left anterior fascicular block Low voltage, extremity  leads Anteroseptal infarct, old Nonspecific T abnormalities, lateral leads Confirmed by Laurice Coy [id number]) on 10/21/2024 11:22:48 AM  Radiology: CT Cervical Spine Wo Contrast Result Date: 10/21/2024 EXAM: CT CERVICAL SPINE WITHOUT CONTRAST 10/21/2024 11:57:06 AM TECHNIQUE: CT of the cervical spine was performed without the administration of intravenous contrast. Multiplanar reformatted images are provided for review. Automated exposure control, iterative reconstruction, and/or weight based adjustment of  the mA/kV was utilized to reduce the radiation dose to as low as reasonably achievable. COMPARISON: None available. CLINICAL HISTORY: Neck trauma (Age >= 65y) FINDINGS: CERVICAL SPINE: BONES AND ALIGNMENT: Straightening of the normal cervical lordosis. No evidence of traumatic malalignment. Schmorl node involving the superior endplate of C7 vertebral body. Vertebral body heights otherwise maintained. Partial fusion of the C2 and C3 vertebral bodies and the C5 and C6 vertebral bodies. Multiple scattered lucent foci in the cervical spine. Consider nonemergent correlation with contrast enhanced MRI of the cervical spine. DEGENERATIVE CHANGES: Asymmetric degenerative changes of the right atlantoaxial articulation. Disc space narrowing throughout the cervical spine greatest at C3-C4 and C5-C6. Disc osteophyte complexes at multiple levels. No high grade osseous spinal canal stenosis. Facet arthrosis and uncovertebral hypertrophy at multiple levels. SOFT TISSUES: No prevertebral soft tissue swelling. LUNGS/PLEURA: Partially visualized right pleural effusion. IMPRESSION: 1. No acute abnormality of the cervical spine related to the reported neck trauma. 2. Multiple scattered lucent foci in the cervical spine. Recommend non-emergent contrast-enhanced MRI of the cervical spine for further evaluation. 3. Degenerative changes as above. 4. Partially visualized right pleural effusion. Electronically signed by: Donnice Mania  MD 10/21/2024 12:19 PM EST RP Workstation: [id number]   CT Head Wo Contrast Result Date: 10/21/2024 EXAM: CT HEAD WITHOUT CONTRAST 10/21/2024 11:57:06 AM TECHNIQUE: CT of the head was performed without the administration of intravenous contrast. Automated exposure control, iterative reconstruction, and/or weight based adjustment of the mA/kV was utilized to reduce the radiation dose to as low as reasonably achievable. COMPARISON: None available. CLINICAL HISTORY: Head trauma, minor (Age >= 65y). FINDINGS: BRAIN AND VENTRICLES: No acute hemorrhage. No evidence of acute infarct. Proportional prominence of ventricles and sulci, consistent with diffuse cerebral parenchymal volume loss. Periventricular and subcortical white matter hypoattenuation, consistent with moderate chronic ischemic microvascular disease. Remote lacunar infarct in the right basal ganglia. No hydrocephalus. No extra-axial collection. No mass effect or midline shift. ORBITS: Left lens replacement. SINUSES: No acute abnormality. SOFT TISSUES AND SKULL: No acute soft tissue abnormality. No skull fracture. VASCULATURE: Calcified atherosclerotic plaque within cavernous/supraclinoid ICA and intradural vertebral arteries. IMPRESSION: 1. No acute intracranial abnormality related to the head trauma. 2. Diffuse cerebral parenchymal volume loss. 3. Moderate chronic ischemic microvascular disease. 4. Remote lacunar infarct in the right basal ganglia. Electronically signed by: Donnice Mania MD 10/21/2024 12:12 PM EST RP Workstation: [id number]     Procedures   Medications Ordered in the ED - No data to display                                  Medical Decision Making Patient found this morning on the floor next to his recliner.  He may have been on the floor the entire night.  Patient with apparent increased confusion per family.  He is on Eliquis  with history of A-fib.  Workup demonstrates AKI, elevated troponin, elevated BNP with suggestion  of pulmonary edema/pleural effusions.  Patient is DNR/DNI.  Patient would benefit from admission for further workup and treatment.  Dr. Krasowski, cardiology is aware of elevated troponins.  He does not feel that cardiology has specific indication for acute intervention at this time.  He recommends medical workup.  Cardiology happy to officially consult if medicine service requires this.  Amount and/or Complexity of Data Reviewed Labs: ordered. Radiology: ordered.  Risk Decision regarding hospitalization.   CRITICAL CARE Performed by: Maude JAYSON Galloway   Total critical care  time: 30 minutes  Critical care time was exclusive of separately billable procedures and treating other patients.  Critical care was necessary to treat or prevent imminent or life-threatening deterioration.  Critical care was time spent personally by me on the following activities: development of treatment plan with patient and/or surrogate as well as nursing, discussions with consultants, evaluation of patient's response to treatment, examination of patient, obtaining history from patient or surrogate, ordering and performing treatments and interventions, ordering and review of laboratory studies, ordering and review of radiographic studies, pulse oximetry and re-evaluation of patient's condition.      Final diagnoses:  Fall, initial encounter  AKI (acute kidney injury)  Elevated troponin    ED Discharge Orders     None          Laurice Maude BROCKS, MD 10/21/24 [phone]

## 2024-10-22 ENCOUNTER — Inpatient Hospital Stay (HOSPITAL_COMMUNITY)

## 2024-10-22 DIAGNOSIS — N179 Acute kidney failure, unspecified: Secondary | ICD-10-CM | POA: Diagnosis not present

## 2024-10-22 LAB — COMPREHENSIVE METABOLIC PANEL WITH GFR
ALT: 50 U/L — ABNORMAL HIGH (ref 0–44)
AST: 51 U/L — ABNORMAL HIGH (ref 15–41)
Albumin: 2.6 g/dL — ABNORMAL LOW (ref 3.5–5.0)
Alkaline Phosphatase: 83 U/L (ref 38–126)
Anion gap: 16 — ABNORMAL HIGH (ref 5–15)
BUN: 46 mg/dL — ABNORMAL HIGH (ref 8–23)
CO2: 24 mmol/L (ref 22–32)
Calcium: 8.9 mg/dL (ref 8.9–10.3)
Chloride: 98 mmol/L (ref 98–111)
Creatinine, Ser: 2.23 mg/dL — ABNORMAL HIGH (ref 0.61–1.24)
GFR, Estimated: 27 mL/min — ABNORMAL LOW (ref >60)
Glucose, Bld: 131 mg/dL — ABNORMAL HIGH (ref 70–99)
Potassium: 4.8 mmol/L (ref 3.5–5.1)
Sodium: 140 mmol/L (ref 135–145)
Total Bilirubin: 1.8 mg/dL — ABNORMAL HIGH (ref 0.0–1.2)
Total Protein: 5.5 g/dL — ABNORMAL LOW (ref 6.5–8.1)

## 2024-10-22 MED ORDER — SODIUM CHLORIDE 0.9 % IV SOLN: INTRAVENOUS | Status: DC

## 2024-10-22 MED ORDER — METOPROLOL TARTRATE 50 MG PO TABS
50.0000 mg | ORAL_TABLET | Freq: Two times a day (BID) | ORAL | Status: DC
  Administered 2024-10-22 – 2024-10-24 (×3): 50 mg via ORAL
  Filled 2024-10-22 (×4): qty 1

## 2024-10-22 NOTE — Consult Note (Signed)
 WOC Nurse Consult Note: Reason for Consult: Requested to assess a sacrum injury. Wound type: DTPI on sacrum Pressure Injury POA: Yes Measurement: aprox. 3 cm x 3 cm.  Wound bed: No open wound. Drainage (amount, consistency, odor) NONE Periwound: peeling skin, redness, purple. Dressing procedure/placement/frequency: Cleanse with warm water and soup, pat dru with wash cloth. Apply Foam dressing to the area, change every 3 days or PRN.  WOC team will not plan to follow further. Please reconsult if further assistance is needed. Thank-you,  Lela Holm MSN, RN, CNS.  (Phone [phone])

## 2024-10-22 NOTE — Plan of Care (Signed)

## 2024-10-22 NOTE — Evaluation (Signed)
 Occupational Therapy Evaluation Patient Details Name: Craig Mann MRN: [id number] DOB: 1933-01-29 Today's Date: 10/22/2024   History of Present Illness   Pt is an 89 year old man admitted on 10/21/24 after a fall with AMS. CT + moderate B pleural effusions, compressive ateletasis, mesenterica and body wall edema. Pt also with AKI and elevated troponins. PMH: afib, HTN, OA, macular degenertion L eye, CKD 3B, B TKAs, B THAs, PVD.     Clinical Impressions Pt lives in [place] [facility] with his 42 year old brother. He walks with a rollator and endorses falls in addition to the one for which he was admitted. He reports sleeping in his lift chair. Pt is able to shower in sitting. He needs assist for donning socks and uses a reacher and long handled bath sponge for LB dressing. He is assisted for all IADLs. Pt presents with impaired memory, generalized weakness and poor standing balance. He needs min assist for all mobiity and min to max assist for ADLs. Patient will benefit from continued inpatient follow up therapy, <3 hours/day prior to return home.      If plan is discharge home, recommend the following:   A little help with walking and/or transfers;A lot of help with bathing/dressing/bathroom;Direct supervision/assist for medications management;Direct supervision/assist for financial management;Assist for transportation;Help with stairs or ramp for entrance;Assistance with cooking/housework     Functional Status Assessment   Patient has had a recent decline in their functional status and demonstrates the ability to make significant improvements in function in a reasonable and predictable amount of time.     Equipment Recommendations   BSC/3in1     Recommendations for Other Services         Precautions/Restrictions   Precautions Precautions: Fall Recall of Precautions/Restrictions: Impaired Restrictions Weight Bearing Restrictions Per Provider Order: No     Mobility  Bed Mobility Overal bed mobility: Needs Assistance Bed Mobility: Rolling, Sidelying to Sit Rolling: Min assist Sidelying to sit: Mod assist       General bed mobility comments: cues for technique, use of rail, assist to shift hips to EOB and raise trunk    Transfers Overall transfer level: Needs assistance Equipment used: Rolling walker (2 wheels) Transfers: Sit to/from Stand Sit to Stand: Min assist, From elevated surface           General transfer comment: cues for hand placment, increased time, assist to risea and steady      Balance Overall balance assessment: Needs assistance   Sitting balance-Leahy Scale: Fair     Standing balance support: No upper extremity supported, Bilateral upper extremity supported Standing balance-Leahy Scale: Poor Standing balance comment: close guard for static standing, RW and up to min assist for ambulation                           ADL either performed or assessed with clinical judgement   ADL Overall ADL's : Needs assistance/impaired Eating/Feeding: Set up;Sitting Eating/Feeding Details (indicate cue type and reason): assist to open packages Grooming: Minimal assistance;Standing   Upper Body Bathing: Minimal assistance;Sitting   Lower Body Bathing: Maximal assistance;Sit to/from stand   Upper Body Dressing : Moderate assistance;Sitting   Lower Body Dressing: Total assistance;Sitting/lateral leans Lower Body Dressing Details (indicate cue type and reason): for slippers Toilet Transfer: Minimal assistance;Ambulation;Rolling walker (2 wheels)           Functional mobility during ADLs: Minimal assistance;Rolling walker (2 wheels);+2 for safety/equipment  Vision Baseline Vision/History: 6 Macular Degeneration;1 Wears glasses Ability to See in Adequate Light: 0 Adequate Patient Visual Report: No change from baseline       Perception         Praxis         Pertinent Vitals/Pain Pain Assessment Pain  Assessment: Faces Pain Location: generalized with bed mobility Pain Descriptors / Indicators: Grimacing Pain Intervention(s): Monitored during session, Repositioned     Extremity/Trunk Assessment Upper Extremity Assessment Upper Extremity Assessment: Overall WFL for tasks assessed   Lower Extremity Assessment Lower Extremity Assessment: Defer to PT evaluation   Cervical / Trunk Assessment Cervical / Trunk Assessment: Kyphotic   Communication Communication Communication: Impaired Factors Affecting Communication: Hearing impaired   Cognition Arousal: Alert Behavior During Therapy: WFL for tasks assessed/performed Cognition: Cognition impaired   Orientation impairments: Situation, Time, Place Awareness: Intellectual awareness intact, Online awareness impaired Memory impairment (select all impairments): Short-term memory, Working Biochemist, Clinical functioning impairment (select all impairments): Initiation, Sequencing, Reasoning OT - Cognition Comments: pt repeatedly thinking he was at home, poor immediate memory                 Following commands: Intact       Cueing  General Comments   Cueing Techniques: Verbal cues;Gestural cues      Exercises     Shoulder Instructions      Home Living Family/patient expects to be discharged to:: Private residence Living Arrangements: Other relatives (34 year old brother) Available Help at Discharge: Family;Available 24 hours/day;Other (Comment) (brother cannot physically assist) Type of Home: Independent living facility ([facility]) Home Access: Level entry     Home Layout: One level     Bathroom Shower/Tub: Producer, Television/film/video: Standard     Home Equipment: Rollator (4 wheels);Electric scooter;Shower seat;Grab bars - toilet;Grab bars - tub/shower;Hand held shower head;Lift chair;Adaptive equipment Adaptive Equipment: Reacher;Long-handled shoe horn        Prior Functioning/Environment Prior  Level of Function : Needs assist             Mobility Comments: walks with rollator in his apartment, scooter or family assist with mobility outside of his home, endorses falls, sleeps in lift chair ADLs Comments: cannot don socks, uses long handled shoe horn to don shoes, showers in sitting, family assists with med and financial management, facility provides meals    OT Problem List: Decreased strength;Impaired balance (sitting and/or standing);Decreased cognition;Impaired sensation;Decreased knowledge of use of DME or AE   OT Treatment/Interventions: Self-care/ADL training;DME and/or AE instruction;Therapeutic activities;Patient/family education;Balance training;Cognitive remediation/compensation      OT Goals(Current goals can be found in the care plan section)   Acute Rehab OT Goals OT Goal Formulation: With patient Time For Goal Achievement: 11/05/24 Potential to Achieve Goals: Good ADL Goals Pt Will Perform Grooming: with supervision;standing Pt Will Perform Lower Body Bathing: with supervision;sit to/from stand;with adaptive equipment Pt Will Perform Lower Body Dressing: with supervision;with adaptive equipment;sit to/from stand Pt Will Transfer to Toilet: with supervision;ambulating;bedside commode (over toilet) Pt Will Perform Toileting - Clothing Manipulation and hygiene: with supervision;sit to/from stand Additional ADL Goal #1: Pt will use environmental clues to respond to orientation questions.   OT Frequency:  Min 2X/week    Co-evaluation              AM-PAC OT 6 Clicks Daily Activity     Outcome Measure Help from another person eating meals?: A Little Help from another person taking care of personal grooming?:  A Little Help from another person toileting, which includes using toliet, bedpan, or urinal?: A Little Help from another person bathing (including washing, rinsing, drying)?: A Lot Help from another person to put on and taking off regular upper  body clothing?: A Little Help from another person to put on and taking off regular lower body clothing?: A Lot 6 Click Score: 16   End of Session Equipment Utilized During Treatment: Gait belt;Rolling walker (2 wheels) Nurse Communication: Mobility status  Activity Tolerance: Patient tolerated treatment well Patient left: in chair;with call bell/phone within reach;with chair alarm set;with nursing/sitter in room  OT Visit Diagnosis: Unsteadiness on feet (R26.81);Other abnormalities of gait and mobility (R26.89);Muscle weakness (generalized) (M62.81);Other symptoms and signs involving cognitive function                Time: 1450-1521 OT Time Calculation (min): 31 min Charges:  OT General Charges $OT Visit: 1 Visit OT Evaluation $OT Eval Moderate Complexity: 1 Mod Mliss Craig, OTR/L Acute Rehabilitation Services Office: [phone]  Kennth Mliss Helling 10/22/2024, 3:43 PM

## 2024-10-22 NOTE — NC FL2 (Signed)
  [place]  MEDICAID FL2 LEVEL OF CARE FORM     IDENTIFICATION  Patient Name: Craig Mann Birthdate: May 15, 1933 Sex: male Admission Date (Current Location): 10/21/2024  [facility] and [place] Number:  Producer, Television/film/video and Address:  [facility], [address], [place], [place] [address]      Provider Number: [id number]  Attending Physician Name and Address:  Raenelle Coria, MD  Relative Name and Phone Number:  Aadvik Roker, brother - [phone]    Current Level of Care: Hospital Recommended Level of Care: Skilled Nursing Facility Prior Approval Number:    Date Approved/Denied:   PASRR Number:  [id number]  Discharge Plan: SNF    Current Diagnoses: Patient Active Problem List   Diagnosis Date Noted   AKI (acute kidney injury) 10/21/2024   Ingrown toenail 01/07/2020   Osteoarthritis of right knee 06/12/2018   S/P TKR (total knee replacement), right 06/12/2018   Persistent atrial fibrillation (HCC) 11/12/2015   Essential hypertension 11/12/2015    Orientation RESPIRATION BLADDER Height & Weight     Self, Time, Situation, Place  Normal External catheter Weight: 83 kg Height:  5' 11 (180.3 cm)  BEHAVIORAL SYMPTOMS/MOOD NEUROLOGICAL BOWEL NUTRITION STATUS      Incontinent Diet (See Discharge Summary)  AMBULATORY STATUS COMMUNICATION OF NEEDS Skin   Limited Assist Verbally Other (Comment) (pressure injury to sacrum)                       Personal Care Assistance Level of Assistance  Bathing, Dressing, Feeding Bathing Assistance: Limited assistance Feeding assistance: Independent Dressing Assistance: Limited assistance     Functional Limitations Info  Sight, Hearing, Speech Sight Info: Adequate Hearing Info: Impaired Speech Info: Impaired    SPECIAL CARE FACTORS FREQUENCY  PT (By licensed PT), OT (By licensed OT)     PT Frequency: 5 x per week OT Frequency: 5 x per week            Contractures Contractures Info:  Not present    Additional Factors Info  Code Status, Allergies, Psychotropic Code Status Info: Full code Allergies Info: hydrochlorothiazide , influenza, lisinopril Psychotropic Info: zoloft         Current Medications (10/22/2024):  This is the current hospital active medication list Current Facility-Administered Medications  Medication Dose Route Frequency Provider Last Rate Last Admin   0.9 %  sodium chloride  infusion   Intravenous Continuous Ghimire, Kuber, MD 40 mL/hr at 10/22/24 1037 New Bag at 10/22/24 1037   apixaban  (ELIQUIS ) tablet 2.5 mg  2.5 mg Oral BID Georgina Basket, MD   2.5 mg at 10/22/24 1032   cefTRIAXone (ROCEPHIN) 1 g in sodium chloride  0.9 % 100 mL IVPB  1 g Intravenous Q24H Moore, Willie, MD 200 mL/hr at 10/22/24 1522 1 g at 10/22/24 1522   feeding supplement (ENSURE PLUS HIGH PROTEIN) liquid 237 mL  237 mL Oral BID BM Georgina Basket, MD   237 mL at 10/22/24 1518   metoprolol  tartrate (LOPRESSOR ) tablet 50 mg  50 mg Oral BID Ghimire, Kuber, MD   50 mg at 10/22/24 1032   sertraline (ZOLOFT) tablet 25 mg  25 mg Oral Daily Georgina Basket, MD   25 mg at 10/22/24 1032     Discharge Medications: Please see discharge summary for a list of discharge medications.  Relevant Imaging Results:  Relevant Lab Results:   Additional Information SS# [id number]  Rosaline JONELLE Joe, RN

## 2024-10-22 NOTE — Evaluation (Signed)
 Physical Therapy Evaluation Patient Details Name: Craig Mann MRN: [id number] DOB: [birth year] Today's Date: 10/22/2024  History of Present Illness  Pt is an 89 year old man admitted on 10/21/24 after a fall with AMS. CT + moderate B pleural effusions, compressive ateletasis, mesenterica and body wall edema. Pt also with AKI and elevated troponins. PMH: afib, HTN, OA, macular degenertion L eye, CKD 3B, B TKAs, B THAs, PVD.  Clinical Impression  Pt admitted with above diagnosis. Reports multiple falls PTA, has gotten weaker recently and struggling to transfer on his own. Pt with some appreciable confusion, frequently forgetting he is in the hospital. Very pleasant. Able to transfer and ambulate with min assist and RW for support. Patient will benefit from continued inpatient follow up therapy, <3 hours/day.  Pt currently with functional limitations due to the deficits listed below (see PT Problem List). Pt will benefit from acute skilled PT to increase their independence and safety with mobility to allow discharge.           If plan is discharge home, recommend the following: A little help with walking and/or transfers;A little help with bathing/dressing/bathroom;Assistance with cooking/housework;Direct supervision/assist for medications management;Direct supervision/assist for financial management;Assist for transportation;Supervision due to cognitive status   Can travel by private vehicle   Yes    Equipment Recommendations Rolling walker (2 wheels)  Recommendations for Other Services       Functional Status Assessment Patient has had a recent decline in their functional status and demonstrates the ability to make significant improvements in function in a reasonable and predictable amount of time.     Precautions / Restrictions Precautions Precautions: Fall Recall of Precautions/Restrictions: Impaired Restrictions Weight Bearing Restrictions Per Provider Order: No       Mobility  Bed Mobility Overal bed mobility: Needs Assistance Bed Mobility: Rolling, Sidelying to Sit Rolling: Min assist, Used rails Sidelying to sit: Mod assist, HOB elevated       General bed mobility comments: Min assist to roll, mod assist  for trunk support to rise. cues for technique, use of rail, assist to shift hips to EOB and raise trunk    Transfers Overall transfer level: Needs assistance Equipment used: Rolling walker (2 wheels) Transfers: Sit to/from Stand Sit to Stand: Min assist, From elevated surface           General transfer comment: Min assist for boost to stand. cues for hand placment, increased time, RW to steady, posterior lean initially upon rising with bracing against bed for support.    Ambulation/Gait Ambulation/Gait assistance: Min assist, +2 safety/equipment Gait Distance (Feet): 75 Feet (x2) Assistive device: Rolling walker (2 wheels) Gait Pattern/deviations: Step-through pattern, Decreased stride length, Drifts right/left, Trunk flexed Gait velocity: dec Gait velocity interpretation: <1.8 ft/sec, indicate of risk for recurrent falls   General Gait Details: intermittent min assist for RW control and balance, +2 for chair follow. No buckling noted, trunk flexed with cues for technique to straighten and anticipate navigating around obstacles ahead.  Stairs            Wheelchair Mobility     Tilt Bed    Modified Rankin (Stroke Patients Only)       Balance Overall balance assessment: Needs assistance Sitting-balance support: No upper extremity supported, Feet supported Sitting balance-Leahy Scale: Fair     Standing balance support: No upper extremity supported, Bilateral upper extremity supported Standing balance-Leahy Scale: Fair Standing balance comment: close guard for static standing, RW and up to min assist for ambulation  Pertinent Vitals/Pain Pain Assessment Pain Assessment:  Faces Faces Pain Scale: Hurts a little bit Pain Location: generalized with bed mobility Pain Descriptors / Indicators: Grimacing Pain Intervention(s): Limited activity within patient's tolerance, Monitored during session, Repositioned    Home Living Family/patient expects to be discharged to:: Private residence Living Arrangements: Other relatives (80 year old brother) Available Help at Discharge: Family;Available 24 hours/day;Other (Comment) (brother cannot physically assist) Type of Home: Independent living facility ([facility]) Home Access: Level entry       Home Layout: One level Home Equipment: Rollator (4 wheels);Electric scooter;Shower seat;Grab bars - toilet;Grab bars - tub/shower;Hand held shower head;Lift chair;Adaptive equipment      Prior Function Prior Level of Function : Needs assist             Mobility Comments: walks with rollator in his apartment, scooter or family assist with mobility outside of his home, endorses falls, sleeps in lift chair ADLs Comments: cannot don socks, uses long handled shoe horn to don shoes, showers in sitting, family assists with med and financial management, facility provides meals     Extremity/Trunk Assessment   Upper Extremity Assessment Upper Extremity Assessment: Defer to OT evaluation    Lower Extremity Assessment Lower Extremity Assessment: Generalized weakness (Edema over pes anserine Lt knee)    Cervical / Trunk Assessment Cervical / Trunk Assessment: Kyphotic  Communication   Communication Communication: Impaired Factors Affecting Communication: Hearing impaired    Cognition Arousal: Alert Behavior During Therapy: WFL for tasks assessed/performed   PT - Cognitive impairments: No family/caregiver present to determine baseline, Orientation, Awareness, Memory   Orientation impairments: Place, Situation                   PT - Cognition Comments: Frequently forgets he is in the hospital and states he is  in his apartment. Following commands: Intact       Cueing Cueing Techniques: Verbal cues, Gestural cues     General Comments      Exercises     Assessment/Plan    PT Assessment Patient needs continued PT services  PT Problem List Decreased strength;Decreased range of motion;Decreased activity tolerance;Decreased balance;Decreased mobility;Decreased knowledge of use of DME;Decreased knowledge of precautions;Decreased cognition       PT Treatment Interventions DME instruction;Gait training;Functional mobility training;Therapeutic activities;Therapeutic exercise;Balance training;Neuromuscular re-education;Cognitive remediation;Patient/family education    PT Goals (Current goals can be found in the Care Plan section)  Acute Rehab PT Goals Patient Stated Goal: Get well PT Goal Formulation: With patient Time For Goal Achievement: 11/05/24 Potential to Achieve Goals: Good    Frequency Min 2X/week     Co-evaluation PT/OT/SLP Co-Evaluation/Treatment: Yes Reason for Co-Treatment: Complexity of the patient's impairments (multi-system involvement);Necessary to address cognition/behavior during functional activity;For patient/therapist safety;To address functional/ADL transfers PT goals addressed during session: Mobility/safety with mobility;Balance;Proper use of DME         AM-PAC PT 6 Clicks Mobility  Outcome Measure Help needed turning from your back to your side while in a flat bed without using bedrails?: A Little Help needed moving from lying on your back to sitting on the side of a flat bed without using bedrails?: A Little Help needed moving to and from a bed to a chair (including a wheelchair)?: A Little Help needed standing up from a chair using your arms (e.g., wheelchair or bedside chair)?: A Little Help needed to walk in hospital room?: A Little Help needed climbing 3-5 steps with a railing? : A Lot 6 Click Score: 17  End of Session Equipment Utilized During  Treatment: Gait belt Activity Tolerance: Patient tolerated treatment well Patient left: in chair;with call bell/phone within reach;with chair alarm set;with nursing/sitter in room Nurse Communication: Mobility status PT Visit Diagnosis: Unsteadiness on feet (R26.81);Other abnormalities of gait and mobility (R26.89);Muscle weakness (generalized) (M62.81);Repeated falls (R29.6);History of falling (Z91.81);Difficulty in walking, not elsewhere classified (R26.2);Other symptoms and signs involving the nervous system (R29.898)    Time: 8552-8478 PT Time Calculation (min) (ACUTE ONLY): 34 min   Charges:   PT Evaluation $PT Eval Low Complexity: 1 Low   PT General Charges $$ ACUTE PT VISIT: 1 Visit         Leontine Roads, PT, DPT [facility]  Rehabilitation Services Physical Therapist Office: [phone] Website: [email or web address]   Leontine GORMAN Roads 10/22/2024, 4:26 PM

## 2024-10-22 NOTE — Progress Notes (Signed)
 PROGRESS NOTE    MALLIE LINNEMANN  FMW:[id number] DOB: Jun 30, 1933 DOA: 10/21/2024 PCP: Valentin Skates, DO    Brief Narrative:  89 year old with history of paroxysmal A-fib, hypertension, CKD stage IIIb, recurrent fall brought to the emergency room with confusion and falling.  Lives in independent living with his brother.  EMS initially found him cyanotic, however it improved rapidly. In the emergency room patient had slight confusion.  Afebrile.  Creatinine 2.54, lactic acid 3.3.  BNP 1173.  Recent known creatinine of 1.6 that was about a month ago.  UA was abnormal patient did not have any urinary symptoms.  CT scan abdomen pelvis with moderate bilateral pleural effusion and compressive atelectasis, mesenteric and body wall edema.  Subjective: Patient seen and examined.  Alert awake and oriented.  Patient tells me that his main problem is he keeps falling and losing balance.  He thinks his right knee gave out so he fell.  Denies any urinary symptoms.  Denies any cough cold congestion.  No other overnight events.  Not mobilized yet.   Assessment & Plan:   AKI on CKD stage IIIb: Known baseline creatinine of 1.6-1.7.  This is likely aggravated by hypovolemia.  CT scan of was consistent with some anasarca, on clinical exam patient is very dehydrated today.  Discontinue IV Lasix. Gentle normal saline 40 mL/h for the next 24 hours.  Monitor renal functions.  Input output monitoring. Holding diuretics and losartan.  Acute UTI, suspected present on admission: Less likely.  Urine culture blood culture pending.  Will continue Rocephin today.  Will check postvoid residuals.  Acute metabolic encephalopathy, recurrent fall: Mental status improved.  No focal deficits.  Start working with PT OT.  Paroxysmal A-fib: Currently sinus rhythm.  Resume Eliquis .  Resume metoprolol .  Troponin elevation: Type II troponinemia.  No evidence of acute coronary syndrome.  PT, OT, mobilize.  May likely need SNF.     DVT prophylaxis: apixaban  (ELIQUIS ) tablet 2.5 mg Start: 10/21/24 1700 SCDs Start: 10/21/24 1509 apixaban  (ELIQUIS ) tablet 2.5 mg   Code Status: Full code Family Communication: None at the bedside Disposition Plan: Status is: Inpatient Remains inpatient appropriate because: Significantly symptomatic     Consultants:  None  Procedures:  None  Antimicrobials:  Rocephin 11/9---     Objective: Vitals:   10/21/24 1956 10/22/24 0033 10/22/24 0435 10/22/24 0848  BP: 132/79 (!) 118/90 121/75 112/75  Pulse: 82 62 72 79  Resp:  20    Temp: 97.8 F (36.6 C) 97.6 F (36.4 C) 97.6 F (36.4 C)   TempSrc: Oral Oral Oral   SpO2: 98% 98% 100% 93%  Weight:      Height:        Intake/Output Summary (Last 24 hours) at 10/22/2024 1050 Last data filed at 10/22/2024 0930 Gross per 24 hour  Intake 240 ml  Output 900 ml  Net -660 ml   Filed Weights   10/21/24 1041  Weight: 83 kg    Examination:  General exam: Appears calm and comfortable.  Pleasant interactive. Dry mucous membranes. Respiratory system: Clear to auscultation. Respiratory effort normal.  No added sounds. Cardiovascular system: S1 & S2 heard, RRR.  No pedal edema. Gastrointestinal system: Abdomen is nondistended, soft and nontender. No organomegaly or masses felt. Normal bowel sounds heard.  There is no abdominal wall edema. Central nervous system: Alert and oriented. No focal neurological deficits.  Grossly weak. Multiple areas of bruises in his legs.    Data Reviewed: I have personally reviewed following  labs and imaging studies  CBC: Recent Labs  Lab 10/21/24 1045 10/21/24 1056  WBC 13.6*  --   NEUTROABS 12.5*  --   HGB 14.2 15.3  HCT 44.4 45.0  MCV 94.3  --   PLT 109*  --    Basic Metabolic Panel: Recent Labs  Lab 10/21/24 1045 10/21/24 1056  NA 137 134*  K 4.5 4.5  CL 98 98  CO2 24  --   GLUCOSE 145* 135*  BUN 40* 40*  CREATININE 2.54* 2.50*  CALCIUM 9.2  --    GFR: Estimated  Creatinine Clearance: 21.3 mL/min (A) (by C-G formula based on SCr of 2.5 mg/dL (H)). Liver Function Tests: Recent Labs  Lab 10/21/24 1045  AST 147*  ALT 89*  ALKPHOS 89  BILITOT 2.7*  PROT 6.6  ALBUMIN 3.1*   No results for input(s): LIPASE, AMYLASE in the last 168 hours. Recent Labs  Lab 10/21/24 1203  AMMONIA <13   Coagulation Profile: No results for input(s): INR, PROTIME in the last 168 hours. Cardiac Enzymes: Recent Labs  Lab 10/21/24 1045  CKTOTAL 88   BNP (last 3 results) No results for input(s): PROBNP in the last 8760 hours. HbA1C: No results for input(s): HGBA1C in the last 72 hours. CBG: No results for input(s): GLUCAP in the last 168 hours. Lipid Profile: No results for input(s): CHOL, HDL, LDLCALC, TRIG, CHOLHDL, LDLDIRECT in the last 72 hours. Thyroid Function Tests: No results for input(s): TSH, T4TOTAL, FREET4, T3FREE, THYROIDAB in the last 72 hours. Anemia Panel: No results for input(s): VITAMINB12, FOLATE, FERRITIN, TIBC, IRON, RETICCTPCT in the last 72 hours. Sepsis Labs: Recent Labs  Lab 10/21/24 1056 10/21/24 1256  LATICACIDVEN 3.3* 2.8*    Recent Results (from the past 240 hours)  Culture, blood (Routine X 2) w Reflex to ID Panel     Status: None (Preliminary result)   Collection Time: 10/21/24  8:09 PM   Specimen: BLOOD  Result Value Ref Range Status   Specimen Description BLOOD SITE NOT SPECIFIED  Final   Special Requests   Final    BOTTLES DRAWN AEROBIC AND ANAEROBIC Blood Culture results may not be optimal due to an inadequate volume of blood received in culture bottles   Culture   Final    NO GROWTH < 12 HOURS Performed at [facility], [address] [place], [place] [address]    Report Status PENDING  Incomplete  Culture, blood (Routine X 2) w Reflex to ID Panel     Status: None (Preliminary result)   Collection Time: 10/21/24  8:18 PM   Specimen: BLOOD  Result Value Ref  Range Status   Specimen Description BLOOD SITE NOT SPECIFIED  Final   Special Requests   Final    BOTTLES DRAWN AEROBIC AND ANAEROBIC Blood Culture results may not be optimal due to an inadequate volume of blood received in culture bottles   Culture   Final    NO GROWTH < 12 HOURS Performed at [facility], [address] [place], [place] [address]    Report Status PENDING  Incomplete         Radiology Studies: CT CHEST ABDOMEN PELVIS WO CONTRAST Result Date: 10/21/2024 EXAM: CT CHEST, ABDOMEN AND PELVIS WITHOUT CONTRAST 10/21/2024 01:11:47 PM TECHNIQUE: CT of the chest, abdomen and pelvis was performed without the administration of intravenous contrast. Multiplanar reformatted images are provided for review. Automated exposure control, iterative reconstruction, and/or weight based adjustment of the mA/kV was  utilized to reduce the radiation dose to as low as reasonably achievable. COMPARISON: Comparison with same day radiographs. CLINICAL HISTORY: Polytrauma, blunt. FINDINGS: CHEST: MEDIASTINUM AND LYMPH NODES: Cardiomegaly. Coronary artery and aortic atherosclerotic calcifications. Assessment for vascular injury is limited without IV contrast. No pericardial effusion. The central airways are clear. No mediastinal, hilar or axillary lymphadenopathy. LUNGS AND PLEURA: Moderate bilateral pleural effusions and compressive atelectasis. Bronchial wall thickening and mucus plugging in the lower lobes. No pneumothorax. ABDOMEN AND PELVIS: LIVER: The liver is unremarkable. GALLBLADDER AND BILE DUCTS: Gallbladder wall thickening favored due to volume status. No biliary ductal dilatation. SPLEEN: No acute abnormality. PANCREAS: No acute abnormality. ADRENAL GLANDS: No acute abnormality. KIDNEYS, URETERS AND BLADDER: Polycystic kidneys. No stones in the kidneys or ureters. No hydronephrosis. No perinephric or periureteral stranding. The bladder is obscured by streak artifact. GI AND BOWEL: Stomach  demonstrates no acute abnormality. There is no bowel obstruction. REPRODUCTIVE ORGANS: No acute abnormality. PERITONEUM AND RETROPERITONEUM: Mesenteric and body wall edema. No ascites. No free air. VASCULATURE: Aorta is normal in caliber. ABDOMINAL AND PELVIS LYMPH NODES: Shotty retroperitoneal lymph nodes. BONES AND SOFT TISSUES: Bilateral hip arthroplasties. No acute osseous abnormality. No focal soft tissue abnormality. IMPRESSION: 1. Moderate bilateral pleural effusions with compressive atelectasis. 2. Lower lobe bronchitis. 3. Mesenteric and body wall edema, likely related to volume overload. 4. Gallbladder wall thickening favored secondary to volume status. Electronically signed by: Norman Gatlin MD 10/21/2024 02:32 PM EST RP Workstation: [id number]   DG Lumbar Spine Complete Result Date: 10/21/2024 EXAM: 4 VIEW(S) XRAY OF THE LUMBAR SPINE 10/21/2024 12:03:00 PM COMPARISON: None available. CLINICAL HISTORY: fall FINDINGS: LUMBAR SPINE: BONES: Mild dextrocurvature of the lumbar spine. There are presumed to be 5 non rib bearing lumbar type vertebral bodies. No radiographic evidence of fracture. Alignment is maintained. No aggressive appearing osseous lesion. DISCS AND DEGENERATIVE CHANGES: No radiographic evidence of pars defect. Disc space narrowing at multiple levels throughout the visualized spine. There is moderate disc space narrowing at L3-L4. Additional mild to moderate disc space narrowing at L2-L3. Degenerative endplate osteophytes at multiple levels. Facet arthrosis most pronounced at L4-L5 and L5-S1. SOFT TISSUES: No acute abnormality. IMPRESSION: 1. No acute abnormality of the lumbar spine related to the fall. 2. Degenerative changes as above. Moderate disc space narrowing at L3-4. Electronically signed by: Donnice Mania MD 10/21/2024 12:26 PM EST RP Workstation: [id number]   DG Pelvis Portable Result Date: 10/21/2024 EXAM: 1 or 2 VIEW(S) XRAY OF THE PELVIS 10/21/2024 12:03:00 PM COMPARISON:  None available. 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 Degenerative changes of the visualized lower lumbar spine. SOFT TISSUES: Vascular calcification. IMPRESSION: 1. No acute findings. 2. 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 3. Degenerative changes of the visualized lower lumbar spine. Electronically signed by: Donnice Mania MD 10/21/2024 12:24 PM EST RP Workstation: [id number]   DG Chest Portable 1 View Result Date: 10/21/2024 EXAM: 1 VIEW(S) XRAY OF THE CHEST 10/21/2024 12:03:00 PM COMPARISON: 06/01/2018 CLINICAL HISTORY: fall FINDINGS: LUNGS AND PLEURA: Retrocardiac airspace opacity. Trace bilateral pleural effusions. Slightly prominent interstitial markings. No pulmonary edema. No pneumothorax. HEART AND MEDIASTINUM: Cardiomegaly. Atherosclerotic plaque noted. BONES AND SOFT TISSUES: No acute osseous abnormality. IMPRESSION: 1. Retrocardiac airspace opacity, consider two view chest xray or CT for further evaluation. 2. Prominent interstitial markings suggestive of mild pulmonary edema. 3. Trace bilateral pleural effusions. Electronically signed by: Donnice Mania MD 10/21/2024 12:21 PM EST RP Workstation: [id number]   CT Cervical Spine Wo Contrast  Result Date: 10/21/2024 EXAM: CT CERVICAL SPINE WITHOUT CONTRAST 10/21/2024 11:57:06 AM TECHNIQUE: CT of the cervical spine was performed without the administration of intravenous contrast. Multiplanar reformatted images are provided for review. Automated exposure control, iterative reconstruction, and/or weight based adjustment of the mA/kV was utilized to reduce the radiation dose to as low as reasonably achievable. COMPARISON: None available. CLINICAL HISTORY: Neck trauma (Age >= 65y) FINDINGS: CERVICAL SPINE: BONES AND ALIGNMENT: Straightening of the normal cervical lordosis. No evidence of traumatic malalignment. Schmorl node involving the superior endplate of C7 vertebral body. Vertebral body  heights otherwise maintained. Partial fusion of the C2 and C3 vertebral bodies and the C5 and C6 vertebral bodies. Multiple scattered lucent foci in the cervical spine. Consider nonemergent correlation with contrast enhanced MRI of the cervical spine. DEGENERATIVE CHANGES: Asymmetric degenerative changes of the right atlantoaxial articulation. Disc space narrowing throughout the cervical spine greatest at C3-C4 and C5-C6. Disc osteophyte complexes at multiple levels. No high grade osseous spinal canal stenosis. Facet arthrosis and uncovertebral hypertrophy at multiple levels. SOFT TISSUES: No prevertebral soft tissue swelling. LUNGS/PLEURA: Partially visualized right pleural effusion. IMPRESSION: 1. No acute abnormality of the cervical spine related to the reported neck trauma. 2. Multiple scattered lucent foci in the cervical spine. Recommend non-emergent contrast-enhanced MRI of the cervical spine for further evaluation. 3. Degenerative changes as above. 4. Partially visualized right pleural effusion. Electronically signed by: Donnice Mania MD 10/21/2024 12:19 PM EST RP Workstation: [id number]   CT Head Wo Contrast Result Date: 10/21/2024 EXAM: CT HEAD WITHOUT CONTRAST 10/21/2024 11:57:06 AM TECHNIQUE: CT of the head was performed without the administration of intravenous contrast. Automated exposure control, iterative reconstruction, and/or weight based adjustment of the mA/kV was utilized to reduce the radiation dose to as low as reasonably achievable. COMPARISON: None available. CLINICAL HISTORY: Head trauma, minor (Age >= 65y). FINDINGS: BRAIN AND VENTRICLES: No acute hemorrhage. No evidence of acute infarct. Proportional prominence of ventricles and sulci, consistent with diffuse cerebral parenchymal volume loss. Periventricular and subcortical white matter hypoattenuation, consistent with moderate chronic ischemic microvascular disease. Remote lacunar infarct in the right basal ganglia. No hydrocephalus.  No extra-axial collection. No mass effect or midline shift. ORBITS: Left lens replacement. SINUSES: No acute abnormality. SOFT TISSUES AND SKULL: No acute soft tissue abnormality. No skull fracture. VASCULATURE: Calcified atherosclerotic plaque within cavernous/supraclinoid ICA and intradural vertebral arteries. IMPRESSION: 1. No acute intracranial abnormality related to the head trauma. 2. Diffuse cerebral parenchymal volume loss. 3. Moderate chronic ischemic microvascular disease. 4. Remote lacunar infarct in the right basal ganglia. Electronically signed by: Donnice Mania MD 10/21/2024 12:12 PM EST RP Workstation: [id number]        Scheduled Meds:  apixaban   2.5 mg Oral BID   feeding supplement  237 mL Oral BID BM   metoprolol  tartrate  50 mg Oral BID   sertraline  25 mg Oral Daily   Continuous Infusions:  sodium chloride  40 mL/hr at 10/22/24 1037   cefTRIAXone (ROCEPHIN)  IV Stopped (10/21/24 1743)     LOS: 1 day    Time spent: 51 minutes    Renato Applebaum, MD [facility]

## 2024-10-22 NOTE — Progress Notes (Signed)
2D echo attempted, therapy in room. Will try later ?

## 2024-10-22 NOTE — Progress Notes (Signed)
 Transition of Care [facility]) - Inpatient Brief Assessment   Patient Details  Name: RIGGIN CUTTINO MRN: [id number] Date of Birth: 03/23/1933  Transition of Care [facility]) CM/SW Contact:    Rosaline JONELLE Joe, RN Phone Number: 10/22/2024, 2:29 PM   Clinical Narrative: Patient admitted to the hospital from [facility].  Patient lives in the apartment and brother lives at the same facility.  Patient admitted to the hospital with recurrent falls and PT/OT pending evaluation at this time.    DME at the ILF apartment includes Electric scooter, RW, [place] and [employer].  Patient is waiting for PT/OT evaluation.  Patient is agreeable to SNF/CIR once evaluated.   Transition of Care Asessment: Insurance and Status: (P) Insurance coverage has been reviewed Patient has primary care physician: (P) Yes Home environment has been reviewed: (P) from [facility] Prior level of function:: (P) self - Prior/Current Home Services: (P) No current home services (DME at the home includes Electric scooter, RW, [place], [facility]) Social Drivers of Health Review: (P) SDOH reviewed needs interventions Readmission risk has been reviewed: (P) Yes Transition of care needs: (P) transition of care needs identified, TOC will continue to follow

## 2024-10-23 ENCOUNTER — Inpatient Hospital Stay (HOSPITAL_COMMUNITY)

## 2024-10-23 DIAGNOSIS — I509 Heart failure, unspecified: Secondary | ICD-10-CM

## 2024-10-23 DIAGNOSIS — N179 Acute kidney failure, unspecified: Secondary | ICD-10-CM | POA: Diagnosis not present

## 2024-10-23 LAB — COMPREHENSIVE METABOLIC PANEL WITH GFR
ALT: 55 U/L — ABNORMAL HIGH (ref 0–44)
AST: 66 U/L — ABNORMAL HIGH (ref 15–41)
Albumin: 2.6 g/dL — ABNORMAL LOW (ref 3.5–5.0)
Alkaline Phosphatase: 93 U/L (ref 38–126)
Anion gap: 12 (ref 5–15)
BUN: 49 mg/dL — ABNORMAL HIGH (ref 8–23)
CO2: 27 mmol/L (ref 22–32)
Calcium: 8.7 mg/dL — ABNORMAL LOW (ref 8.9–10.3)
Chloride: 99 mmol/L (ref 98–111)
Creatinine, Ser: 2.17 mg/dL — ABNORMAL HIGH (ref 0.61–1.24)
GFR, Estimated: 28 mL/min — ABNORMAL LOW (ref >60)
Glucose, Bld: 139 mg/dL — ABNORMAL HIGH (ref 70–99)
Potassium: 3.7 mmol/L (ref 3.5–5.1)
Sodium: 138 mmol/L (ref 135–145)
Total Bilirubin: 1.2 mg/dL (ref 0.0–1.2)
Total Protein: 5.7 g/dL — ABNORMAL LOW (ref 6.5–8.1)

## 2024-10-23 LAB — ECHOCARDIOGRAM COMPLETE
Area-P 1/2: 4.55 cm2
Calc EF: 28.5 %
Height: 71 in
MV VTI: 0.49 cm2
S' Lateral: 4.9 cm
Single Plane A2C EF: 32 %
Single Plane A4C EF: 21.7 %
Weight: 2927.71 [oz_av]

## 2024-10-23 LAB — TSH: TSH: 3.107 u[IU]/mL (ref 0.350–4.500)

## 2024-10-23 LAB — VITAMIN B12: Vitamin B-12: 1402 pg/mL — ABNORMAL HIGH (ref 180–914)

## 2024-10-23 LAB — FOLATE: Folate: 11.4 ng/mL (ref >5.9)

## 2024-10-23 MED ORDER — PERFLUTREN LIPID MICROSPHERE
1.0000 mL | INTRAVENOUS | Status: AC | PRN
  Administered 2024-10-23: 2 mL via INTRAVENOUS

## 2024-10-23 MED ORDER — LORAZEPAM 2 MG/ML IJ SOLN
1.0000 mg | Freq: Once | INTRAMUSCULAR | Status: AC
  Administered 2024-10-23: 1 mg via INTRAVENOUS
  Filled 2024-10-23: qty 1

## 2024-10-23 MED ORDER — FUROSEMIDE 40 MG PO TABS
40.0000 mg | ORAL_TABLET | Freq: Every day | ORAL | Status: DC
  Administered 2024-10-23 – 2024-10-24 (×2): 40 mg via ORAL
  Filled 2024-10-23 (×2): qty 1

## 2024-10-23 NOTE — Plan of Care (Signed)

## 2024-10-23 NOTE — Progress Notes (Signed)
 PROGRESS NOTE    Craig Mann  FMW:[id number] DOB: 03/29/1933 DOA: 10/21/2024 PCP: Valentin Skates, DO    Brief Narrative:  89 year old with history of paroxysmal A-fib, hypertension, CKD stage IIIb, recurrent fall brought to the emergency room with confusion and falling.  Lives in independent living with his brother.  EMS initially found him cyanotic, however it improved rapidly. In the emergency room patient had slight confusion.  Afebrile.  Creatinine 2.54, lactic acid 3.3.  BNP 1173.  Recent known creatinine of 1.6 that was about a month ago.  UA was abnormal patient did not have any urinary symptoms.  CT scan abdomen pelvis with moderate bilateral pleural effusion and compressive atelectasis, mesenteric and body wall edema.  Subjective: Patient seen and examined.  Up in the chair today.  Did not feel any dizziness.  Feels hydrated.  Denies any chest pain or shortness of breath.  Agrees with some rehab.  Discussed about going to short-term skilled nursing facility before going back to his independent living and he is agreeable. Will discontinue IV fluids to avoid fluid overload.  Does not endorse any urinary symptoms, will discontinue antibiotics.   Assessment & Plan:   AKI on CKD stage IIIb: Known baseline creatinine of 1.6-1.7.  This is likely aggravated by hypovolemia.  CT scan was consistent with some anasarca, on clinical exam patient was dehydrated.  Patient was given gentle IV fluids over last 24 hours with improvement of symptoms and improvement of urine output.  Discontinue losartan altogether.  Encourage oral hydration.  On discharge, will prescribe diuretics as needed for weight gain and edema.  Renal functions 2.54-2.23-2.17.  Will need recheck in 1 week as outpatient.  Acute UTI, suspected present on admission: Less likely.  Urine culture was unfortunately not collected.  Blood cultures negative.  Rocephin x 2 given.  Will check postvoid residual.  Patient does not have  urinary symptoms, discontinue antibiotics.    Acute metabolic encephalopathy, recurrent fall: Mental status improved.  No focal deficits.  Mobilize.  Paroxysmal A-fib: Currently sinus rhythm.  Resume Eliquis  and metoprolol .  Troponin elevation: Type II troponinemia.  No evidence of acute coronary syndrome.  Abnormal LFTs: Likely secondary to shock liver.  Already trending down.  Recheck in 1 week.  PT, OT, mobilize.  Stable to go to SNF.    DVT prophylaxis: apixaban  (ELIQUIS ) tablet 2.5 mg Start: 10/21/24 1700 SCDs Start: 10/21/24 1509 apixaban  (ELIQUIS ) tablet 2.5 mg   Code Status: Full code Family Communication: None at the bedside.  Patient's nephew Mr. Marlon was called and updated yesterday. Disposition Plan: Status is: Inpatient Remains inpatient appropriate because: Medically stabilized.  Can go to a SNF.     Consultants:  None  Procedures:  None  Antimicrobials:  Rocephin 11/9--11/11     Objective: Vitals:   10/22/24 1945 10/23/24 0001 10/23/24 0613 10/23/24 0738  BP: 109/85 106/72 (!) 122/91 99/75  Pulse: 66 63 71 72  Resp:    18  Temp: (!) 97.5 F (36.4 C) 97.6 F (36.4 C) (!) 97.4 F (36.3 C) 97.7 F (36.5 C)  TempSrc:    Oral  SpO2: 99% 91% 98% 94%  Weight:      Height:        Intake/Output Summary (Last 24 hours) at 10/23/2024 1041 Last data filed at 10/23/2024 0600 Gross per 24 hour  Intake 880 ml  Output 1250 ml  Net -370 ml   Filed Weights   10/21/24 1041  Weight: 83 kg  Examination:  General exam: Pleasant and interactive.  Alert awake and oriented. Respiratory system: Clear to auscultation. Respiratory effort normal.  No added sounds. Cardiovascular system: S1 & S2 heard, RRR.  No pedal edema. Gastrointestinal system: Abdomen is nondistended, soft and nontender. No organomegaly or masses felt. Normal bowel sounds heard.  No peripheral edema. Central nervous system: Alert and oriented. No focal neurological deficits.  Grossly  weak. Multiple areas of bruises in his legs.    Data Reviewed: I have personally reviewed following labs and imaging studies  CBC: Recent Labs  Lab 10/21/24 1045 10/21/24 1056  WBC 13.6*  --   NEUTROABS 12.5*  --   HGB 14.2 15.3  HCT 44.4 45.0  MCV 94.3  --   PLT 109*  --    Basic Metabolic Panel: Recent Labs  Lab 10/21/24 1045 10/21/24 1056 10/22/24 1148 10/23/24 0317  NA 137 134* 140 138  K 4.5 4.5 4.8 3.7  CL 98 98 98 99  CO2 24  --  24 27  GLUCOSE 145* 135* 131* 139*  BUN 40* 40* 46* 49*  CREATININE 2.54* 2.50* 2.23* 2.17*  CALCIUM 9.2  --  8.9 8.7*   GFR: Estimated Creatinine Clearance: 24.6 mL/min (A) (by C-G formula based on SCr of 2.17 mg/dL (H)). Liver Function Tests: Recent Labs  Lab 10/21/24 1045 10/22/24 1148 10/23/24 0317  AST 147* 51* 66*  ALT 89* 50* 55*  ALKPHOS 89 83 93  BILITOT 2.7* 1.8* 1.2  PROT 6.6 5.5* 5.7*  ALBUMIN 3.1* 2.6* 2.6*   No results for input(s): LIPASE, AMYLASE in the last 168 hours. Recent Labs  Lab 10/21/24 1203  AMMONIA <13   Coagulation Profile: No results for input(s): INR, PROTIME in the last 168 hours. Cardiac Enzymes: Recent Labs  Lab 10/21/24 1045  CKTOTAL 88   BNP (last 3 results) No results for input(s): PROBNP in the last 8760 hours. HbA1C: No results for input(s): HGBA1C in the last 72 hours. CBG: No results for input(s): GLUCAP in the last 168 hours. Lipid Profile: No results for input(s): CHOL, HDL, LDLCALC, TRIG, CHOLHDL, LDLDIRECT in the last 72 hours. Thyroid Function Tests: No results for input(s): TSH, T4TOTAL, FREET4, T3FREE, THYROIDAB in the last 72 hours. Anemia Panel: No results for input(s): VITAMINB12, FOLATE, FERRITIN, TIBC, IRON, RETICCTPCT in the last 72 hours. Sepsis Labs: Recent Labs  Lab 10/21/24 1056 10/21/24 1256  LATICACIDVEN 3.3* 2.8*    Recent Results (from the past 240 hours)  Culture, blood (Routine X 2) w Reflex  to ID Panel     Status: None (Preliminary result)   Collection Time: 10/21/24  8:09 PM   Specimen: BLOOD  Result Value Ref Range Status   Specimen Description BLOOD SITE NOT SPECIFIED  Final   Special Requests   Final    BOTTLES DRAWN AEROBIC AND ANAEROBIC Blood Culture results may not be optimal due to an inadequate volume of blood received in culture bottles   Culture   Final    NO GROWTH 2 DAYS Performed at [facility], [address], [place], [place] [address]    Report Status PENDING  Incomplete  Culture, blood (Routine X 2) w Reflex to ID Panel     Status: None (Preliminary result)   Collection Time: 10/21/24  8:18 PM   Specimen: BLOOD  Result Value Ref Range Status   Specimen Description BLOOD SITE NOT SPECIFIED  Final   Special Requests   Final    BOTTLES DRAWN AEROBIC  AND ANAEROBIC Blood Culture results may not be optimal due to an inadequate volume of blood received in culture bottles   Culture   Final    NO GROWTH 2 DAYS Performed at [facility], [address], [place], [place] [address]    Report Status PENDING  Incomplete         Radiology Studies: CT CHEST ABDOMEN PELVIS WO CONTRAST Result Date: 10/21/2024 EXAM: CT CHEST, ABDOMEN AND PELVIS WITHOUT CONTRAST 10/21/2024 01:11:47 PM TECHNIQUE: CT of the chest, abdomen and pelvis was performed without the administration of intravenous contrast. Multiplanar reformatted images are provided for review. Automated exposure control, iterative reconstruction, and/or weight based adjustment of the mA/kV was utilized to reduce the radiation dose to as low as reasonably achievable. COMPARISON: Comparison with same day radiographs. CLINICAL HISTORY: Polytrauma, blunt. FINDINGS: CHEST: MEDIASTINUM AND LYMPH NODES: Cardiomegaly. Coronary artery and aortic atherosclerotic calcifications. Assessment for vascular injury is limited without IV contrast. No pericardial effusion. The central airways are clear. No mediastinal,  hilar or axillary lymphadenopathy. LUNGS AND PLEURA: Moderate bilateral pleural effusions and compressive atelectasis. Bronchial wall thickening and mucus plugging in the lower lobes. No pneumothorax. ABDOMEN AND PELVIS: LIVER: The liver is unremarkable. GALLBLADDER AND BILE DUCTS: Gallbladder wall thickening favored due to volume status. No biliary ductal dilatation. SPLEEN: No acute abnormality. PANCREAS: No acute abnormality. ADRENAL GLANDS: No acute abnormality. KIDNEYS, URETERS AND BLADDER: Polycystic kidneys. No stones in the kidneys or ureters. No hydronephrosis. No perinephric or periureteral stranding. The bladder is obscured by streak artifact. GI AND BOWEL: Stomach demonstrates no acute abnormality. There is no bowel obstruction. REPRODUCTIVE ORGANS: No acute abnormality. PERITONEUM AND RETROPERITONEUM: Mesenteric and body wall edema. No ascites. No free air. VASCULATURE: Aorta is normal in caliber. ABDOMINAL AND PELVIS LYMPH NODES: Shotty retroperitoneal lymph nodes. BONES AND SOFT TISSUES: Bilateral hip arthroplasties. No acute osseous abnormality. No focal soft tissue abnormality. IMPRESSION: 1. Moderate bilateral pleural effusions with compressive atelectasis. 2. Lower lobe bronchitis. 3. Mesenteric and body wall edema, likely related to volume overload. 4. Gallbladder wall thickening favored secondary to volume status. Electronically signed by: Norman Gatlin MD 10/21/2024 02:32 PM EST RP Workstation: [id number]   DG Lumbar Spine Complete Result Date: 10/21/2024 EXAM: 4 VIEW(S) XRAY OF THE LUMBAR SPINE 10/21/2024 12:03:00 PM COMPARISON: None available. CLINICAL HISTORY: fall FINDINGS: LUMBAR SPINE: BONES: Mild dextrocurvature of the lumbar spine. There are presumed to be 5 non rib bearing lumbar type vertebral bodies. No radiographic evidence of fracture. Alignment is maintained. No aggressive appearing osseous lesion. DISCS AND DEGENERATIVE CHANGES: No radiographic evidence of pars defect. Disc  space narrowing at multiple levels throughout the visualized spine. There is moderate disc space narrowing at L3-L4. Additional mild to moderate disc space narrowing at L2-L3. Degenerative endplate osteophytes at multiple levels. Facet arthrosis most pronounced at L4-L5 and L5-S1. SOFT TISSUES: No acute abnormality. IMPRESSION: 1. No acute abnormality of the lumbar spine related to the fall. 2. Degenerative changes as above. Moderate disc space narrowing at L3-4. Electronically signed by: Donnice Mania MD 10/21/2024 12:26 PM EST RP Workstation: [id number]   DG Pelvis Portable Result Date: 10/21/2024 EXAM: 1 or 2 VIEW(S) XRAY OF THE PELVIS 10/21/2024 12:03:00 PM COMPARISON: None available. 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 Degenerative changes of the visualized lower lumbar spine. SOFT TISSUES: Vascular calcification. IMPRESSION: 1. No acute findings. 2. 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 3. Degenerative changes of the visualized lower lumbar spine. Electronically signed  by: Donnice Mania MD 10/21/2024 12:24 PM EST RP Workstation: [id number]   DG Chest Portable 1 View Result Date: 10/21/2024 EXAM: 1 VIEW(S) XRAY OF THE CHEST 10/21/2024 12:03:00 PM COMPARISON: 06/01/2018 CLINICAL HISTORY: fall FINDINGS: LUNGS AND PLEURA: Retrocardiac airspace opacity. Trace bilateral pleural effusions. Slightly prominent interstitial markings. No pulmonary edema. No pneumothorax. HEART AND MEDIASTINUM: Cardiomegaly. Atherosclerotic plaque noted. BONES AND SOFT TISSUES: No acute osseous abnormality. IMPRESSION: 1. Retrocardiac airspace opacity, consider two view chest xray or CT for further evaluation. 2. Prominent interstitial markings suggestive of mild pulmonary edema. 3. Trace bilateral pleural effusions. Electronically signed by: Donnice Mania MD 10/21/2024 12:21 PM EST RP Workstation: [id number]   CT Cervical Spine Wo Contrast Result Date:  10/21/2024 EXAM: CT CERVICAL SPINE WITHOUT CONTRAST 10/21/2024 11:57:06 AM TECHNIQUE: CT of the cervical spine was performed without the administration of intravenous contrast. Multiplanar reformatted images are provided for review. Automated exposure control, iterative reconstruction, and/or weight based adjustment of the mA/kV was utilized to reduce the radiation dose to as low as reasonably achievable. COMPARISON: None available. CLINICAL HISTORY: Neck trauma (Age >= 65y) FINDINGS: CERVICAL SPINE: BONES AND ALIGNMENT: Straightening of the normal cervical lordosis. No evidence of traumatic malalignment. Schmorl node involving the superior endplate of C7 vertebral body. Vertebral body heights otherwise maintained. Partial fusion of the C2 and C3 vertebral bodies and the C5 and C6 vertebral bodies. Multiple scattered lucent foci in the cervical spine. Consider nonemergent correlation with contrast enhanced MRI of the cervical spine. DEGENERATIVE CHANGES: Asymmetric degenerative changes of the right atlantoaxial articulation. Disc space narrowing throughout the cervical spine greatest at C3-C4 and C5-C6. Disc osteophyte complexes at multiple levels. No high grade osseous spinal canal stenosis. Facet arthrosis and uncovertebral hypertrophy at multiple levels. SOFT TISSUES: No prevertebral soft tissue swelling. LUNGS/PLEURA: Partially visualized right pleural effusion. IMPRESSION: 1. No acute abnormality of the cervical spine related to the reported neck trauma. 2. Multiple scattered lucent foci in the cervical spine. Recommend non-emergent contrast-enhanced MRI of the cervical spine for further evaluation. 3. Degenerative changes as above. 4. Partially visualized right pleural effusion. Electronically signed by: Donnice Mania MD 10/21/2024 12:19 PM EST RP Workstation: [id number]   CT Head Wo Contrast Result Date: 10/21/2024 EXAM: CT HEAD WITHOUT CONTRAST 10/21/2024 11:57:06 AM TECHNIQUE: CT of the head was performed  without the administration of intravenous contrast. Automated exposure control, iterative reconstruction, and/or weight based adjustment of the mA/kV was utilized to reduce the radiation dose to as low as reasonably achievable. COMPARISON: None available. CLINICAL HISTORY: Head trauma, minor (Age >= 65y). FINDINGS: BRAIN AND VENTRICLES: No acute hemorrhage. No evidence of acute infarct. Proportional prominence of ventricles and sulci, consistent with diffuse cerebral parenchymal volume loss. Periventricular and subcortical white matter hypoattenuation, consistent with moderate chronic ischemic microvascular disease. Remote lacunar infarct in the right basal ganglia. No hydrocephalus. No extra-axial collection. No mass effect or midline shift. ORBITS: Left lens replacement. SINUSES: No acute abnormality. SOFT TISSUES AND SKULL: No acute soft tissue abnormality. No skull fracture. VASCULATURE: Calcified atherosclerotic plaque within cavernous/supraclinoid ICA and intradural vertebral arteries. IMPRESSION: 1. No acute intracranial abnormality related to the head trauma. 2. Diffuse cerebral parenchymal volume loss. 3. Moderate chronic ischemic microvascular disease. 4. Remote lacunar infarct in the right basal ganglia. Electronically signed by: Donnice Mania MD 10/21/2024 12:12 PM EST RP Workstation: [id number]        Scheduled Meds:  apixaban   2.5 mg Oral BID   feeding supplement  237 mL Oral BID BM   metoprolol   tartrate  50 mg Oral BID   sertraline  25 mg Oral Daily   Continuous Infusions:     LOS: 2 days    Time spent: 35 minutes    Renato Applebaum, MD [facility]

## 2024-10-23 NOTE — Progress Notes (Signed)
 Patient had been noted to have occasional delirium and hallucinations.  On my exam, he is mostly alert oriented and very pleasant interactive.  Found to be confused at times.  Infection was ruled out. I also met with patient's nephew at the bedside and we discussed in detail.  Patient had 2 falls last week hitting his head.  No neurological deficits.  CT scan of the head on arrival was negative for acute findings.  Hospital-acquired delirium, rule out concussion or TIA: MRI of the brain today, patient has agreed to it. Will check TSH, B12 and folic acid. Continue to work with PT OT.  Echocardiogram reported ejection fraction of 20 to 25%.  I discussed this with patient's healthcare power of attorney Mr. Garrel Holzer(patient's nephew).  Last echocardiogram available in the records is from 2017 with ejection fraction 50 to 55%.  Patient currently euvolemic.  I discussed with patient and family that he has very poor ejection fraction and high risk of decompensation. Patient does not want to go through any invasive cardiac investigations including cardiac cath.  He had multiple failed cardioversion attempts for A-fib and he does not want to go through any cardioversion again.  We discussed about symptomatic management.  Patient is already on beta-blockers.  Will start patient on Lasix 40 mg daily.  Currently unable to tolerate Diovan until kidney functions improved. Family agreed to have palliative care consultation, discussed goal of care and also DNR.  Patient told me he would like to be full code but he will think about making himself DNR.

## 2024-10-23 NOTE — Progress Notes (Signed)
 Mobility Specialist: Progress Note   10/23/24 1246  Mobility  Activity Ambulated with assistance  Level of Assistance Contact guard assist, steadying assist  Assistive Device Front wheel walker  Distance Ambulated (ft) 90 ft  Activity Response Tolerated well  Mobility Referral Yes  Mobility visit 1 Mobility  Mobility Specialist Start Time (ACUTE ONLY) [id number]  Mobility Specialist Stop Time (ACUTE ONLY) 0930  Mobility Specialist Time Calculation (min) (ACUTE ONLY) 13 min    Pt received in chair, pleasantly confused and agreeable to mobility session. Mod cues for redirection. ModA for STS from chair. MinG for ambulation, c/o L knee pain. Flexed posture and limited bil knee extension during ambulation but pt stated this is the aftermath of fall + prev. LE surgeries. Fatigued by EOS, returned to room without fault. Left in chair with all needs met, call bell in reach. Chair alarm on.   Ileana Lute Mobility Specialist Please contact via SecureChat or Rehab office at [phone]

## 2024-10-23 NOTE — Plan of Care (Signed)

## 2024-10-23 NOTE — Progress Notes (Signed)
  Echocardiogram 2D Echocardiogram has been performed.  Craig Mann 10/23/2024, 11:11 AM

## 2024-10-23 NOTE — TOC Initial Note (Signed)
 Transition of Care [facility]) - Initial/Assessment Note    Patient Details  Name: Craig Mann MRN: [id number] Date of Birth: 1933/10/07  Transition of [facility]) CM/SW Contact:    Sherline Clack, LCSWA Phone Number: 10/23/2024, 3:38 PM  Clinical Narrative:                  CSW spoke with nephew regarding patient's anticipated discharge plan. CSW presented facility list with Medicare ratings to the nephew over the phone. Nephew states he has heard good things about [facility] and identified [facility] as his choice. CSW will reach out the facility and will also make provider aware. CSW will continue to follow. Nephew shared he would prefer if patient was transported via nonemergency ambulance due to not being in town. CSW will call [facility] when patient is ready for discharge. CSW will continue to follow.   Expected Discharge Plan: Skilled Nursing Facility Barriers to Discharge: Continued Medical Work up (3 inpatient nights)   Patient Goals and CMS Choice     Choice offered to / list presented to : Patient (nephew)      Expected Discharge Plan and Services                                              Prior Living Arrangements/Services                       Activities of Daily Living   ADL Screening (condition at time of admission) Independently performs ADLs?: (P) Yes (appropriate for developmental age) Does the patient have a NEW difficulty with bathing/dressing/toileting/self-feeding that is expected to last >3 days?: (P) No Does the patient have a NEW difficulty with getting in/out of bed, walking, or climbing stairs that is expected to last >3 days?: (P) No Does the patient have a NEW difficulty with communication that is expected to last >3 days?: (P) No Is the patient deaf or have difficulty hearing?: (P) Yes Does the patient have difficulty seeing, even when wearing glasses/contacts?: (P) No Does the patient have difficulty concentrating,  remembering, or making decisions?: (P) No  Permission Sought/Granted                  Emotional Assessment Appearance:: Appears stated age Attitude/Demeanor/Rapport: Unable to Assess Affect (typically observed): Unable to Assess Orientation: : Oriented to Self, Oriented to Place, Oriented to  Time, Oriented to Situation      Admission diagnosis:  Elevated troponin [R79.89] AKI (acute kidney injury) [N17.9] Fall, initial encounter [W19.XXXA] Patient Active Problem List   Diagnosis Date Noted   AKI (acute kidney injury) 10/21/2024   Ingrown toenail 01/07/2020   Osteoarthritis of right knee 06/12/2018   S/P TKR (total knee replacement), right 06/12/2018   Persistent atrial fibrillation (HCC) 11/12/2015   Essential hypertension 11/12/2015   PCP:  Valentin Skates, DO Pharmacy:   [facility] [address] [place] - [address] [place] [address] Phone: [phone] Fax: [phone]     Social Drivers of Health (SDOH) Social History: SDOH Screenings   Physical Activity: Sufficiently Active (06/21/2023)   Received from [facility]  Tobacco Use: Medium Risk (10/21/2024)   SDOH Interventions:     Readmission Risk Interventions    10/22/2024    2:27 PM  Readmission Risk Prevention Plan  Post Dischage Appt Complete  Medication  Screening Complete  Transportation Screening Complete

## 2024-10-24 DIAGNOSIS — G9341 Metabolic encephalopathy: Secondary | ICD-10-CM

## 2024-10-24 DIAGNOSIS — I5020 Unspecified systolic (congestive) heart failure: Secondary | ICD-10-CM | POA: Diagnosis not present

## 2024-10-24 DIAGNOSIS — I639 Cerebral infarction, unspecified: Secondary | ICD-10-CM

## 2024-10-24 DIAGNOSIS — R296 Repeated falls: Secondary | ICD-10-CM | POA: Diagnosis not present

## 2024-10-24 DIAGNOSIS — N3000 Acute cystitis without hematuria: Secondary | ICD-10-CM | POA: Diagnosis not present

## 2024-10-24 DIAGNOSIS — N179 Acute kidney failure, unspecified: Secondary | ICD-10-CM | POA: Diagnosis not present

## 2024-10-24 MED ORDER — ATORVASTATIN CALCIUM 40 MG PO TABS: 40.0000 mg | ORAL_TABLET | Freq: Every day | ORAL | Status: DC

## 2024-10-24 MED ORDER — FUROSEMIDE 40 MG PO TABS: 40.0000 mg | ORAL_TABLET | Freq: Every day | ORAL | Status: AC

## 2024-10-24 NOTE — Progress Notes (Signed)
 Pt too lethargic after MRI to take oral meds, 2200 medications held & provider (Dr. Lawence) notified. No new orders at this time.

## 2024-10-24 NOTE — Discharge Summary (Signed)
 Physician Discharge Summary   Patient: Craig Mann MRN: [id number] DOB: [birth year]  Admit date:     10/21/2024  Discharge date: 10/24/24  Discharge Physician: Carliss LELON Canales   PCP: Valentin Skates, DO   Recommendations at discharge:    Pt to be discharged [facility].   If you experience worsening fever, chills, chest pain, shortness of breath, or other concerning symptoms, please call your PCP or go to the emergency department immediately.  Discharge Diagnoses: Principal Problem:   AKI (acute kidney injury)  Resolved Problems:   * No resolved hospital problems. *   Hospital Course:  89 year old with history of paroxysmal A-fib, hypertension, CKD stage IIIb, recurrent fall brought to the emergency room with confusion and falling.  Lives in independent living with his brother.  EMS initially found him cyanotic, however it improved rapidly. In the emergency room patient had slight confusion.  Afebrile.  Creatinine 2.54, lactic acid 3.3.  BNP 1173.  Recent known creatinine of 1.6 that was about a month ago.  UA was abnormal patient did not have any urinary symptoms.  CT scan abdomen pelvis with moderate bilateral pleural effusion and compressive atelectasis, mesenteric and body wall edema.  Assessment and Plan:  Acute metabolic encephalopathy/recurrent fall - Etiology unclear.  Likely contribution of volume depletion, acute CVA, underlying depression.  Appears to be resolved.  Evaluated by PT/OT.  Able to go to SNF.  Acute CVA - Initial CT negative however MRI noting Few subcentimeter foci of diffusion signal abnormality involving the cortical to subcortical aspect of the right parietal lobe, suspicious for tiny acute to early subacute ischemic infarcts.  Etiology likely from underlying A-fib.  Patient already on appropriate Eliquis  dose.  Will initiate atorvastatin.  PT OT as above.  New HFrEF - Ejection fraction noted to be 20-25% on new echo, which is new for patient.  However  at this not appear to be acutely exacerbated, likely chronic.  Discussion with patient's healthcare POA stating they do not want to pursue invasive cardiac procedures such as cardiac cath and do not want to go through cardioversion.  Proceed with symptomatic management.  Currently on 2/4 GDMT (beta-blocker/Jardiance).  Unable to tolerate ACE/ARB/Entresto secondary to renal function and hypotensive.  Can possibly reevaluate in coming weeks if to add Entresto/ACE/ARB and spironolactone as tolerated.  Increase p.o. Lasix to 40 mg daily.  Suspected urinary tract infection - Appears less likely.  Urine culture unfortunately not collected.  Rocephin x 2 given.  Appears resolved.  Paroxysmal atrial fibrillation - Eliquis  and metoprolol  on board.  Elevated troponin - No evidence of acute coronary syndrome.  Likely demand ischemia given minimal elevation in flat.  Transaminitis - Likely secondary to shock liver in the setting of dehydration, HFrEF.  Showed downtrend/resolution.    Consultants: None Procedures performed: None Disposition: Skilled nursing facility Diet recommendation:  Discharge Diet Orders (From admission, onward)     Start     Ordered   10/24/24 0000  Diet - low sodium heart healthy        10/24/24 1130           Cardiac and Carb modified diet  DISCHARGE MEDICATION: Allergies as of 10/24/2024       Reactions   Hydrochlorothiazide     Other reaction(s): Headache, Drowsy, Headache, Drowsy   Influenza Vaccines    Other reaction(s): Fever   Lisinopril    Other reaction(s): Cough        Medication List     STOP taking these  medications    losartan 25 MG tablet Commonly known as: COZAAR       TAKE these medications    allopurinol 100 MG tablet Commonly known as: ZYLOPRIM Take 300 mg by mouth See admin instructions. Take 1 tablet Mon/Wed/Fri   apixaban  2.5 MG Tabs tablet Commonly known as: ELIQUIS  Take 2.5 mg by mouth 2 (two) times daily.    atorvastatin 40 MG tablet Commonly known as: Lipitor Take 1 tablet (40 mg total) by mouth daily.   Cholecalciferol 250 MCG (10000 UT) Caps Take 1 capsule by mouth daily.   cyanocobalamin 500 MCG tablet Commonly known as: VITAMIN B12 Take 500 mcg by mouth daily.   empagliflozin 25 MG Tabs tablet Commonly known as: JARDIANCE Take 12.5 mg by mouth daily.   furosemide 40 MG tablet Commonly known as: LASIX Take 1 tablet (40 mg total) by mouth daily. Start taking on: October 25, 2024 What changed:  medication strength how much to take   metoprolol  tartrate 50 MG tablet Commonly known as: LOPRESSOR  Take 50 mg by mouth 2 (two) times daily. With breakfast & supper   PRESERVISION AREDS 2 PO Take 1 tablet by mouth 2 (two) times daily.   OCUVITE-LUTEIN PO Take 1 tablet by mouth 2 (two) times daily.   sertraline 50 MG tablet Commonly known as: ZOLOFT Take 25 mg by mouth daily.   Systane 0.4-0.3 % Soln Generic drug: Polyethyl Glycol-Propyl Glycol Place 1 drop into both eyes 2 (two) times daily. SYSTANE               Discharge Care Instructions  (From admission, onward)           Start     Ordered   10/24/24 0000  Discharge wound care:       Comments: Sacrum: Cleanse with warm water and soup, pat dru with wash cloth. Apply Foam dressing to the area, change every 3 days or PRN.   10/24/24 1130            Contact information for after-discharge care     Destination     [facility] [place] .   Service: Skilled Nursing Contact information: [address] [place]  [id number] [phone]                     Discharge Exam: Fredricka Weights   10/21/24 1041  Weight: 83 kg    GENERAL:  Alert, pleasant, no acute distress, frail HEENT:  EOMI, hard of hearing CARDIOVASCULAR:  RRR RESPIRATORY:  Clear to auscultation, no wheezing, rales, or rhonchi GASTROINTESTINAL:  Soft, nontender, nondistended EXTREMITIES: Thin,  no LE edema bilaterally NEURO:  No new focal deficits appreciated SKIN:  No rashes noted PSYCH:  Appropriate mood and affect     Condition at discharge: improving  The results of significant diagnostics from this hospitalization (including imaging, microbiology, ancillary and laboratory) are listed below for reference.   Imaging Studies: MR BRAIN WO CONTRAST Result Date: 10/24/2024 CLINICAL DATA:  Initial evaluation for acute TIA. EXAM: MRI HEAD WITHOUT CONTRAST TECHNIQUE: Multiplanar, multiecho pulse sequences of the brain and surrounding structures were obtained without intravenous contrast. COMPARISON:  CT from 10/21/2024 FINDINGS: Brain: Diffuse prominence of the CSF containing spaces compatible generalized cerebral atrophy. Patchy and confluent T2/FLAIR hyperintensity involving the periventricular and deep white matter both cerebral hemispheres, consistent with chronic small vessel ischemic disease, moderate to advanced in nature. Few subcentimeter foci of diffusion signal abnormality seen involving the cortical to subcortical  aspect of the right parietal lobe (series 5, image 88), suspicious for tiny acute to early subacute ischemic infarct. No associated hemorrhage or mass effect. No other evidence for acute or subacute ischemia. Gray-white matter differentiation otherwise maintained. No acute or chronic intracranial blood products. No mass lesion, midline shift or mass effect. Mild ventricular prominence related global parenchymal volume loss without hydrocephalus. No extra-axial fluid collection. Pituitary gland within normal limits. Vascular: Major intracranial vascular flow voids are maintained. Left vertebral artery tortuous and invaginate upon the left aspect of the medulla. Skull and upper cervical spine: Craniocervical junction within normal limits. Bone marrow signal intensity overall within normal limits. No scalp soft tissue abnormality. Sinuses/Orbits: Prior ocular lens replacement  on the left. Paranasal sinuses are largely clear. No significant mastoid effusion. Other: None. IMPRESSION: 1. Few subcentimeter foci of diffusion signal abnormality involving the cortical to subcortical aspect of the right parietal lobe, suspicious for tiny acute to early subacute ischemic infarcts. No associated hemorrhage or mass effect. 2. Underlying age-related cerebral atrophy with moderate to advanced chronic microvascular ischemic disease. Electronically Signed   By: Morene Hoard M.D.   On: 10/24/2024 00:34   ECHOCARDIOGRAM COMPLETE Result Date: 10/23/2024    ECHOCARDIOGRAM REPORT   Patient Name:   YUG LORIA Date of Exam: 10/23/2024 Medical Rec #:  [id number]        Height:       71.0 in Accession #:    [id number]       Weight:       183.0 lb Date of Birth:  02/25/1933        BSA:          2.030 m Patient Age:    89 years         BP:           99/75 mmHg Patient Gender: M                HR:           81 bpm. Exam Location:  Inpatient Procedure: 2D Echo, 3D Echo, Cardiac Doppler, Color Doppler and Intracardiac            Opacification Agent (Both Spectral and Color Flow Doppler were            utilized during procedure). Indications:    I50.40* Unspecified combined systolic (congestive) and diastolic                 (congestive) heart failure  History:        Patient has no prior history of Echocardiogram examinations.                 Abnormal ECG, Arrythmias:Atrial Fibrillation; Risk                 Factors:Hypertension. UTI.  Sonographer:    Ellouise Mose RDCS Referring Phys: [id number] [facility]  Sonographer Comments: Patient with AMS, found on foot of bed. Patient had gotten out of chair by himself as alarm was not on. Delay 15 minutes to get into bed with assistance. IMPRESSIONS  1. Left ventricular ejection fraction, by estimation, is 20 to 25%. Left ventricular ejection fraction by 3D volume is 23 %. Left ventricular ejection fraction by 2D MOD biplane is 28.5 %. Left ventricular  ejection fraction by PLAX is 13 %. The left ventricle has severely decreased function. The left ventricle demonstrates global hypokinesis. The left ventricular internal cavity size was mildly to moderately dilated. Left ventricular diastolic  parameters are indeterminate.  2. Right ventricular systolic function is moderately reduced. The right ventricular size is normal. There is mildly elevated pulmonary artery systolic pressure. The estimated right ventricular systolic pressure is 39.1 mmHg.  3. Left atrial size was mildly dilated.  4. Right atrial size was mildly dilated.  5. The mitral valve is degenerative. Mild to moderate mitral valve regurgitation. No evidence of mitral stenosis.  6. The aortic valve is tricuspid. Aortic valve regurgitation is trivial. Aortic valve sclerosis is present, with no evidence of aortic valve stenosis.  7. Aortic dilatation noted. There is mild dilatation of the ascending aorta, measuring 41 mm. There is mild dilatation of the aortic root, measuring 43 mm. Comparison(s): No prior Echocardiogram. FINDINGS  Left Ventricle: Left ventricular ejection fraction, by estimation, is 20 to 25%. Left ventricular ejection fraction by PLAX is 13 %. Left ventricular ejection fraction by 2D MOD biplane is 28.5 %. Left ventricular ejection fraction by 3D volume is 23 %.  The left ventricle has severely decreased function. The left ventricle demonstrates global hypokinesis. Definity contrast agent was given IV to delineate the left ventricular endocardial borders. The left ventricular internal cavity size was mildly to moderately dilated. There is borderline concentric left ventricular hypertrophy. Left ventricular diastolic function could not be evaluated due to atrial fibrillation. Left ventricular diastolic parameters are indeterminate. Right Ventricle: The right ventricular size is normal. No increase in right ventricular wall thickness. Right ventricular systolic function is moderately reduced.  There is mildly elevated pulmonary artery systolic pressure. The tricuspid regurgitant velocity is 2.79 m/s, and with an assumed right atrial pressure of 8 mmHg, the estimated right ventricular systolic pressure is 39.1 mmHg. Left Atrium: Left atrial size was mildly dilated. Right Atrium: Right atrial size was mildly dilated. Pericardium: There is no evidence of pericardial effusion. Mitral Valve: The mitral valve is degenerative in appearance. Mild mitral annular calcification. Mild to moderate mitral valve regurgitation. No evidence of mitral valve stenosis. MV peak gradient, 69.9 mmHg. The mean mitral valve gradient is 43.0 mmHg. Tricuspid Valve: The tricuspid valve is normal in structure. Tricuspid valve regurgitation is mild . No evidence of tricuspid stenosis. Aortic Valve: The aortic valve is tricuspid. Aortic valve regurgitation is trivial. Aortic valve sclerosis is present, with no evidence of aortic valve stenosis. Pulmonic Valve: The pulmonic valve was normal in structure. Pulmonic valve regurgitation is trivial. No evidence of pulmonic stenosis. Aorta: Aortic dilatation noted. There is mild dilatation of the ascending aorta, measuring 41 mm. There is mild dilatation of the aortic root, measuring 43 mm. IAS/Shunts: No atrial level shunt detected by color flow Doppler. Additional Comments: 3D was performed not requiring image post processing on an independent workstation and was abnormal.  LEFT VENTRICLE PLAX 2D                        Biplane EF (MOD) LV EF:         Left            LV Biplane EF:   Left                ventricular                      ventricular                ejection  ejection                fraction by                      fraction by                PLAX is 13                       2D MOD                %.                               biplane is LVIDd:         5.20 cm                          28.5 %. LVIDs:         4.90 cm LV PW:         1.25 cm LV IVS:         1.25 cm LVOT diam:     2.40 cm         3D Volume EF LV SV:         66              LV 3D EF:    Left LV SV Index:   33                           ventricul LVOT Area:     4.52 cm                     ar                                             ejection                                             fraction LV Volumes (MOD)                            by 3D LV vol d, MOD    97.5 ml                    volume is A2C:                                        23 %. LV vol d, MOD    95.6 ml A4C: LV vol s, MOD    66.3 ml       3D Volume EF: A2C:                           3D EF:        23 % LV vol s, MOD    74.9 ml       LV EDV:       154  ml A4C:                           LV ESV:       118 ml LV SV MOD A2C:   31.2 ml       LV SV:        36 ml LV SV MOD A4C:   95.6 ml LV SV MOD BP:    28.3 ml RIGHT VENTRICLE             IVC RV S prime:     19.32 cm/s  IVC diam: 2.50 cm TAPSE (M-mode): 0.6 cm                             PULMONARY VEINS                             Diastolic Velocity: 20.60 cm/s                             S/D Velocity:       1.60                             Systolic Velocity:  32.70 cm/s LEFT ATRIUM             Index        RIGHT ATRIUM           Index LA diam:        2.70 cm 1.33 cm/m   RA Area:     21.20 cm LA Vol (A2C):   51.0 ml 25.12 ml/m  RA Volume:   64.10 ml  31.57 ml/m LA Vol (A4C):   59.3 ml 29.21 ml/m LA Biplane Vol: 56.0 ml 27.58 ml/m  AORTIC VALVE LVOT Vmax:   87.60 cm/s LVOT Vmean:  58.400 cm/s LVOT VTI:    0.146 m  AORTA Ao Root diam: 4.00 cm Ao Asc diam:  4.10 cm MITRAL VALVE               TRICUSPID VALVE MV Area (PHT): 4.55 cm    TR Peak grad:   31.1 mmHg MV Area VTI:   0.49 cm    TR Vmax:        279.00 cm/s MV Peak grad:  69.9 mmHg MV Mean grad:  43.0 mmHg   SHUNTS MV Vmax:       4.18 m/s    Systemic VTI:  0.15 m MV Vmean:      314.0 cm/s  Systemic Diam: 2.40 cm MV Decel Time: 167 msec MV E velocity: 92.03 cm/s Jerel Croitoru MD Electronically signed by Jerel Balding MD Signature  Date/Time: 10/23/2024/1:14:21 PM    Final    CT CHEST ABDOMEN PELVIS WO CONTRAST Result Date: 10/21/2024 EXAM: CT CHEST, ABDOMEN AND PELVIS WITHOUT CONTRAST 10/21/2024 01:11:47 PM TECHNIQUE: CT of the chest, abdomen and pelvis was performed without the administration of intravenous contrast. Multiplanar reformatted images are provided for review. Automated exposure control, iterative reconstruction, and/or weight based adjustment of the mA/kV was utilized to reduce the radiation dose to as low as reasonably achievable. COMPARISON: Comparison with same day radiographs. CLINICAL HISTORY: Polytrauma, blunt. FINDINGS: CHEST: MEDIASTINUM AND LYMPH NODES: Cardiomegaly. Coronary artery and aortic atherosclerotic calcifications. Assessment for vascular injury is  limited without IV contrast. No pericardial effusion. The central airways are clear. No mediastinal, hilar or axillary lymphadenopathy. LUNGS AND PLEURA: Moderate bilateral pleural effusions and compressive atelectasis. Bronchial wall thickening and mucus plugging in the lower lobes. No pneumothorax. ABDOMEN AND PELVIS: LIVER: The liver is unremarkable. GALLBLADDER AND BILE DUCTS: Gallbladder wall thickening favored due to volume status. No biliary ductal dilatation. SPLEEN: No acute abnormality. PANCREAS: No acute abnormality. ADRENAL GLANDS: No acute abnormality. KIDNEYS, URETERS AND BLADDER: Polycystic kidneys. No stones in the kidneys or ureters. No hydronephrosis. No perinephric or periureteral stranding. The bladder is obscured by streak artifact. GI AND BOWEL: Stomach demonstrates no acute abnormality. There is no bowel obstruction. REPRODUCTIVE ORGANS: No acute abnormality. PERITONEUM AND RETROPERITONEUM: Mesenteric and body wall edema. No ascites. No free air. VASCULATURE: Aorta is normal in caliber. ABDOMINAL AND PELVIS LYMPH NODES: Shotty retroperitoneal lymph nodes. BONES AND SOFT TISSUES: Bilateral hip arthroplasties. No acute osseous abnormality. No  focal soft tissue abnormality. IMPRESSION: 1. Moderate bilateral pleural effusions with compressive atelectasis. 2. Lower lobe bronchitis. 3. Mesenteric and body wall edema, likely related to volume overload. 4. Gallbladder wall thickening favored secondary to volume status. Electronically signed by: Norman Gatlin MD 10/21/2024 02:32 PM EST RP Workstation: [id number]   DG Lumbar Spine Complete Result Date: 10/21/2024 EXAM: 4 VIEW(S) XRAY OF THE LUMBAR SPINE 10/21/2024 12:03:00 PM COMPARISON: None available. CLINICAL HISTORY: fall FINDINGS: LUMBAR SPINE: BONES: Mild dextrocurvature of the lumbar spine. There are presumed to be 5 non rib bearing lumbar type vertebral bodies. No radiographic evidence of fracture. Alignment is maintained. No aggressive appearing osseous lesion. DISCS AND DEGENERATIVE CHANGES: No radiographic evidence of pars defect. Disc space narrowing at multiple levels throughout the visualized spine. There is moderate disc space narrowing at L3-L4. Additional mild to moderate disc space narrowing at L2-L3. Degenerative endplate osteophytes at multiple levels. Facet arthrosis most pronounced at L4-L5 and L5-S1. SOFT TISSUES: No acute abnormality. IMPRESSION: 1. No acute abnormality of the lumbar spine related to the fall. 2. Degenerative changes as above. Moderate disc space narrowing at L3-4. Electronically signed by: Donnice Mania MD 10/21/2024 12:26 PM EST RP Workstation: [id number]   DG Pelvis Portable Result Date: 10/21/2024 EXAM: 1 or 2 VIEW(S) XRAY OF THE PELVIS 10/21/2024 12:03:00 PM COMPARISON: None available. 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 Degenerative changes of the visualized lower lumbar spine. SOFT TISSUES: Vascular calcification. IMPRESSION: 1. No acute findings. 2. 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 3. Degenerative changes of the visualized lower lumbar spine. Electronically signed by: Donnice Mania MD  10/21/2024 12:24 PM EST RP Workstation: [id number]   DG Chest Portable 1 View Result Date: 10/21/2024 EXAM: 1 VIEW(S) XRAY OF THE CHEST 10/21/2024 12:03:00 PM COMPARISON: 06/01/2018 CLINICAL HISTORY: fall FINDINGS: LUNGS AND PLEURA: Retrocardiac airspace opacity. Trace bilateral pleural effusions. Slightly prominent interstitial markings. No pulmonary edema. No pneumothorax. HEART AND MEDIASTINUM: Cardiomegaly. Atherosclerotic plaque noted. BONES AND SOFT TISSUES: No acute osseous abnormality. IMPRESSION: 1. Retrocardiac airspace opacity, consider two view chest xray or CT for further evaluation. 2. Prominent interstitial markings suggestive of mild pulmonary edema. 3. Trace bilateral pleural effusions. Electronically signed by: Donnice Mania MD 10/21/2024 12:21 PM EST RP Workstation: [id number]   CT Cervical Spine Wo Contrast Result Date: 10/21/2024 EXAM: CT CERVICAL SPINE WITHOUT CONTRAST 10/21/2024 11:57:06 AM TECHNIQUE: CT of the cervical spine was performed without the administration of intravenous contrast. Multiplanar reformatted images are provided for review. Automated exposure control, iterative reconstruction, and/or weight  based adjustment of the mA/kV was utilized to reduce the radiation dose to as low as reasonably achievable. COMPARISON: None available. CLINICAL HISTORY: Neck trauma (Age >= 65y) FINDINGS: CERVICAL SPINE: BONES AND ALIGNMENT: Straightening of the normal cervical lordosis. No evidence of traumatic malalignment. Schmorl node involving the superior endplate of C7 vertebral body. Vertebral body heights otherwise maintained. Partial fusion of the C2 and C3 vertebral bodies and the C5 and C6 vertebral bodies. Multiple scattered lucent foci in the cervical spine. Consider nonemergent correlation with contrast enhanced MRI of the cervical spine. DEGENERATIVE CHANGES: Asymmetric degenerative changes of the right atlantoaxial articulation. Disc space narrowing throughout the cervical spine  greatest at C3-C4 and C5-C6. Disc osteophyte complexes at multiple levels. No high grade osseous spinal canal stenosis. Facet arthrosis and uncovertebral hypertrophy at multiple levels. SOFT TISSUES: No prevertebral soft tissue swelling. LUNGS/PLEURA: Partially visualized right pleural effusion. IMPRESSION: 1. No acute abnormality of the cervical spine related to the reported neck trauma. 2. Multiple scattered lucent foci in the cervical spine. Recommend non-emergent contrast-enhanced MRI of the cervical spine for further evaluation. 3. Degenerative changes as above. 4. Partially visualized right pleural effusion. Electronically signed by: Donnice Mania MD 10/21/2024 12:19 PM EST RP Workstation: [id number]   CT Head Wo Contrast Result Date: 10/21/2024 EXAM: CT HEAD WITHOUT CONTRAST 10/21/2024 11:57:06 AM TECHNIQUE: CT of the head was performed without the administration of intravenous contrast. Automated exposure control, iterative reconstruction, and/or weight based adjustment of the mA/kV was utilized to reduce the radiation dose to as low as reasonably achievable. COMPARISON: None available. CLINICAL HISTORY: Head trauma, minor (Age >= 65y). FINDINGS: BRAIN AND VENTRICLES: No acute hemorrhage. No evidence of acute infarct. Proportional prominence of ventricles and sulci, consistent with diffuse cerebral parenchymal volume loss. Periventricular and subcortical white matter hypoattenuation, consistent with moderate chronic ischemic microvascular disease. Remote lacunar infarct in the right basal ganglia. No hydrocephalus. No extra-axial collection. No mass effect or midline shift. ORBITS: Left lens replacement. SINUSES: No acute abnormality. SOFT TISSUES AND SKULL: No acute soft tissue abnormality. No skull fracture. VASCULATURE: Calcified atherosclerotic plaque within cavernous/supraclinoid ICA and intradural vertebral arteries. IMPRESSION: 1. No acute intracranial abnormality related to the head trauma. 2.  Diffuse cerebral parenchymal volume loss. 3. Moderate chronic ischemic microvascular disease. 4. Remote lacunar infarct in the right basal ganglia. Electronically signed by: Donnice Mania MD 10/21/2024 12:12 PM EST RP Workstation: [id number]   US  ABDOMEN COMPLETE W/ELASTOGRAPHY Result Date: 10/01/2024 CLINICAL DATA:  Hepatic steatosis and elevated liver function tests EXAM: ULTRASOUND ABDOMEN ULTRASOUND HEPATIC ELASTOGRAPHY TECHNIQUE: Sonography of the upper abdomen was performed. In addition, ultrasound elastography evaluation of the liver was performed. A region of interest was placed within the right lobe of the liver. Following application of a compressive sonographic pulse, tissue compressibility was assessed. Multiple assessments were performed at the selected site. Median tissue compressibility was determined. Previously, hepatic stiffness was assessed by shear wave velocity. Based on recently published Society of Radiologists in Ultrasound consensus article, reporting is now recommended to be performed in the SI units of pressure (kiloPascals) representing hepatic stiffness/elasticity. The obtained result is compared to the published reference standards. (cACLD = compensated Advanced Chronic Liver Disease) COMPARISON:  None Available. FINDINGS: ULTRASOUND ABDOMEN Gallbladder: No gallstones or wall thickening visualized. Contains layering sludge. No sonographic Murphy sign noted by sonographer. Common bile duct: Diameter: 5 mm Liver: Nodular hepatic contour. No focal lesion identified. Within normal limits in parenchymal echogenicity. Portal vein is patent on color Doppler imaging with normal direction  of blood flow towards the liver. IVC: No abnormality visualized. Pancreas: Suboptimally evaluated due to overlying bowel gas. Spleen: Enlarged, 14.5 x 14.3 x 5.8 cm, 633 mL. Right Kidney: Length: 11.8 cm. Multicystic kidney. Largest cyst measures 5.0 cm in the upper pole. Left Kidney: Length: 8.1 cm.  Multicystic kidney. Largest cyst measures 10.3 cm. Abdominal aorta: No aneurysm visualized. Other findings: Trace perihepatic ascites. Partially imaged bilateral pleural effusions. ULTRASOUND HEPATIC ELASTOGRAPHY Device: Siemens Helix VTQ Patient position: Supine Transducer 9C2 Number of measurements: 12 Hepatic segment:  8 Median kPa: 12.6 IQR: 3.6 IQR/Median kPa ratio: 0.29 Data quality:  Good Diagnostic category: >9 kPa and ?13 kPa: suggestive of cACLD, but needs further testing The use of hepatic elastography is applicable to patients with viral hepatitis and non-alcoholic fatty liver disease. At this time, there is insufficient data for the referenced cut-off values and use in other causes of liver disease, including alcoholic liver disease. Patients, however, may be assessed by elastography and serve as their own reference standard/baseline. In patients with non-alcoholic liver disease, the values suggesting compensated advanced chronic liver disease (cACLD) may be lower, and patients may need additional testing with elasticity results of 7-9 kPa. Please note that abnormal hepatic elasticity and shear wave velocities may also be identified in clinical settings other than with hepatic fibrosis, such as: acute hepatitis, elevated right heart and central venous pressures including use of beta blockers, veno-occlusive disease (Budd-Chiari), infiltrative processes such as mastocytosis/amyloidosis/infiltrative tumor/lymphoma, extrahepatic cholestasis, with hyperemia in the post-prandial state, and with liver transplantation. Correlation with patient history, laboratory data, and clinical condition recommended. Diagnostic Categories: < or =5 kPa: high probability of being normal < or =9 kPa: in the absence of other known clinical signs, rules out cACLD >9 kPa and ?13 kPa: suggestive of cACLD, but needs further testing >13 kPa: highly suggestive of cACLD > or =17 kPa: highly suggestive of cACLD with an increased  probability of clinically significant portal hypertension IMPRESSION: ULTRASOUND ABDOMEN: 1. Nodular hepatic contour, which can be seen in the setting of cirrhosis. No focal hepatic lesions. 2. Splenomegaly. 3. Trace perihepatic ascites. Partially imaged bilateral pleural effusions. 4. Bilateral multicystic kidneys. ULTRASOUND HEPATIC ELASTOGRAPHY: Median kPa:  12.6 Diagnostic category: >9 kPa and ?13 kPa: suggestive of cACLD, but needs further testing Electronically Signed   By: Limin  Xu M.D.   On: 10/01/2024 13:51    Microbiology: Results for orders placed or performed during the hospital encounter of 10/21/24  Culture, blood (Routine X 2) w Reflex to ID Panel     Status: None (Preliminary result)   Collection Time: 10/21/24  8:09 PM   Specimen: BLOOD  Result Value Ref Range Status   Specimen Description BLOOD SITE NOT SPECIFIED  Final   Special Requests   Final    BOTTLES DRAWN AEROBIC AND ANAEROBIC Blood Culture results may not be optimal due to an inadequate volume of blood received in culture bottles   Culture   Final    NO GROWTH 3 DAYS Performed at [facility], [address], [place], [place] [address]    Report Status PENDING  Incomplete  Culture, blood (Routine X 2) w Reflex to ID Panel     Status: None (Preliminary result)   Collection Time: 10/21/24  8:18 PM   Specimen: BLOOD  Result Value Ref Range Status   Specimen Description BLOOD SITE NOT SPECIFIED  Final   Special Requests   Final    BOTTLES DRAWN AEROBIC AND ANAEROBIC Blood Culture results  may not be optimal due to an inadequate volume of blood received in culture bottles   Culture   Final    NO GROWTH 3 DAYS Performed at [facility], [address] [place], [place] [address]    Report Status PENDING  Incomplete    Labs: CBC: Recent Labs  Lab 10/21/24 1045 10/21/24 1056  WBC 13.6*  --   NEUTROABS 12.5*  --   HGB 14.2 15.3  HCT 44.4 45.0  MCV 94.3  --   PLT 109*  --    Basic Metabolic  Panel: Recent Labs  Lab 10/21/24 1045 10/21/24 1056 10/22/24 1148 10/23/24 0317  NA 137 134* 140 138  K 4.5 4.5 4.8 3.7  CL 98 98 98 99  CO2 24  --  24 27  GLUCOSE 145* 135* 131* 139*  BUN 40* 40* 46* 49*  CREATININE 2.54* 2.50* 2.23* 2.17*  CALCIUM 9.2  --  8.9 8.7*   Liver Function Tests: Recent Labs  Lab 10/21/24 1045 10/22/24 1148 10/23/24 0317  AST 147* 51* 66*  ALT 89* 50* 55*  ALKPHOS 89 83 93  BILITOT 2.7* 1.8* 1.2  PROT 6.6 5.5* 5.7*  ALBUMIN 3.1* 2.6* 2.6*   CBG: No results for input(s): GLUCAP in the last 168 hours.  Discharge time spent: 31 minutes.  Length of inpatient stay: 3 days  Signed: Carliss LELON Canales, DO [facility] 10/24/2024

## 2024-10-24 NOTE — Care Management Important Message (Signed)
 Important Message  Patient Details  Name: Craig Mann MRN: [id number] Date of Birth: 08-Apr-1933   Important Message Given:        Claretta Deed 10/24/2024, 1:46 PM

## 2024-10-24 NOTE — TOC Transition Note (Signed)
 Transition of Care [facility]) - Discharge Note   Patient Details  Name: Craig Mann MRN: [id number] Date of Birth: [birth year]  Transition of Care [facility] CM/SW Contact:  Sherline Clack, LCSWA Phone Number: 10/24/2024, 12:44 PM   Clinical Narrative:     Patient will DC to: [facility] Anticipated DC date: 10/24/24  Family notified: [place]/nephew Transport by: [facility]   Per MD patient ready for DC to [facility]. RN to call report prior to discharge [phone], rm [phone]). RN, patient, patient's family, and facility notified of DC. Discharge Summary and FL2 sent to facility. DC packet on chart. Ambulance transport requested for patient.   CSW will sign off for now as social work intervention is no longer needed. Please consult us  again if new needs arise.    Final next level of care: Skilled Nursing Facility Barriers to Discharge: Barriers Resolved   Patient Goals and CMS Choice     Choice offered to / list presented to : Patient      Discharge Placement              Patient [facility] bed at: [facility] Patient to be transferred to facility by: [facility] Name of family member notified: [place]/nephew Patient and family notified of of transfer: 10/24/24  Discharge Plan and Services Additional resources added to the After Visit Summary for                                       Social Drivers of Health (SDOH) Interventions SDOH Screenings   Physical Activity: Sufficiently Active (06/21/2023)   Received from [facility]  Tobacco Use: Medium Risk (10/21/2024)     Readmission Risk Interventions    10/22/2024    2:27 PM  Readmission Risk Prevention Plan  Post Dischage Appt Complete  Medication Screening Complete  Transportation Screening Complete

## 2024-10-24 NOTE — Care Management Obs Status (Signed)
 MEDICARE OBSERVATION STATUS NOTIFICATION   Patient Details  Name: LENON KUENNEN MRN: [id number] Date of Birth: [birth year]   Medicare Observation Status Notification Given:       Claretta Deed 10/24/2024, 1:55 PM

## 2024-10-24 NOTE — Progress Notes (Signed)
 Heart Failure Navigator Progress Note  Assessed for Heart & Vascular TOC clinic readiness.  Patient does not meet criteria due to admission with confusion and fall, Acute metabolic encephalopathy, acute CVA that resolved. Patient being discharged to [facility]. Has a scheduled [facility] appointment on 11/20/2024. A Palliative Care Consult was placed. No HF TOC.   Navigator will sign off at this time.   Stephane Haddock, BSN, Scientist, Clinical (histocompatibility And Immunogenetics) Only

## 2024-10-26 LAB — CULTURE, BLOOD (ROUTINE X 2)
Culture: NO GROWTH
Culture: NO GROWTH

## 2024-11-20 ENCOUNTER — Ambulatory Visit: Admitting: Gastroenterology

## 2024-11-20 ENCOUNTER — Encounter: Payer: Self-pay | Admitting: Gastroenterology

## 2024-11-20 VITALS — BP 112/50 | HR 69

## 2024-11-20 DIAGNOSIS — I509 Heart failure, unspecified: Secondary | ICD-10-CM

## 2024-11-20 DIAGNOSIS — K769 Liver disease, unspecified: Secondary | ICD-10-CM

## 2024-11-20 DIAGNOSIS — R7989 Other specified abnormal findings of blood chemistry: Secondary | ICD-10-CM

## 2024-11-20 NOTE — Patient Instructions (Signed)
 You have been scheduled for an abdominal ultrasound at [facility] (1st floor of hospital) on 11/27/24 at 10:00 am . Please arrive 30 minutes prior to your appointment for registration. Make certain not to have anything to eat or drink 6 hours prior to your appointment. Should you need to reschedule your appointment, please contact radiology at [phone]. This test typically takes about 30 minutes to perform.  Please have labs drawn at your PCP office. If labs can not be drawn at your PCP office, please come to our lab located in the basement of the building.   Due to recent changes in healthcare laws, you may see the results of your imaging and laboratory studies on MyChart before your provider has had a chance to review them.  We understand that in some cases there may be results that are confusing or concerning to you. Not all laboratory results come back in the same time frame and the provider may be waiting for multiple results in order to interpret others.  Please give us  48 hours in order for your provider to thoroughly review all the results before contacting the office for clarification of your results.   _______________________________________________________  If your blood pressure at your visit was 140/90 or greater, please contact your primary care physician to follow up on this.  _______________________________________________________  If you are age 1 or older, your body mass index should be between 23-30. Your There is no height or weight on file to calculate BMI. If this is out of the aforementioned range listed, please consider follow up with your Primary Care Provider.  If you are age 77 or younger, your body mass index should be between 19-25. Your There is no height or weight on file to calculate BMI. If this is out of the aformentioned range listed, please consider follow up with your Primary Care Provider.    ________________________________________________________  The [facility] providers would like to encourage you to use MYCHART to communicate with providers for non-urgent requests or questions.  Due to long hold times on the telephone, sending your provider a message by [facility] may be a faster and more efficient way to get a response.  Please allow 48 business hours for a response.  Please remember that this is for non-urgent requests.  _______________________________________________________  [facility] is using a team-based approach to care.  Your team is made up of your doctor and two to three APPS. Our APPS (Nurse Practitioners and Physician Assistants) work with your physician to ensure care continuity for you. They are fully qualified to address your health concerns and develop a treatment plan. They communicate directly with your gastroenterologist to care for you. Seeing the Advanced Practice Practitioners on your physician's team can help you by facilitating care more promptly, often allowing for earlier appointments, access to diagnostic testing, procedures, and other specialty referrals.   Thank you for choosing me and [facility].  Dr. [place]

## 2024-11-20 NOTE — Progress Notes (Unsigned)
 GASTROENTEROLOGY OUTPATIENT CLINIC VISIT   Primary Care Provider Valentin Skates, DO [address] [place] [address] [phone]  Referring Provider Valentin Skates, DO [address] [place],  [place] [address] [phone]  Patient Profile: Craig Mann is a 89 y.o. male with a pmh significant for  The patient presents to the [facility] for an evaluation and management of problem(s) noted below:  Problem List No diagnosis found.  History of Present Illness    The patient does/does not take NSAIDs or BC/Goody Powder. Patient has/has not had an EGD. Patient has/has not had a Colonoscopy.  GI Review of Systems Positive as above Negative for  Pyrosis; Reflux; Regurgitation; Dysphagia; Odynophagia; Globus; Post-prandial cough; Nocturnal cough; Nasal regurgitation; Epigastric pain; Nausea; Vomiting; Hematemesis; Jaundice; Change in Appetite; Early satiety; Abdominal pain; Abdominal bloating; Eructation; Flatulence; Change in BM Frequency; Change in BM Consistency; Constipation; Diarrhea; Incontinence; Urgency; Tenesmus; Hematochezia; Melena  Review of Systems General: Denies fevers/chills/weight loss unintentionally Cardiovascular: Denies chest pain Pulmonary: Denies shortness of breath Gastroenterological: See HPI Genitourinary: Denies darkened urine Hematological: Denies easy bruising/bleeding Endocrine: Denies temperature intolerance Dermatological: Denies jaundice Psychological: Mood is stable  Medications Current Outpatient Medications  Medication Sig Dispense Refill   allopurinol (ZYLOPRIM) 100 MG tablet Take 300 mg by mouth See admin instructions. Take 1 tablet Mon/Wed/Fri     apixaban  (ELIQUIS ) 2.5 MG TABS tablet Take 2.5 mg by mouth 2 (two) times daily.     Cholecalciferol 250 MCG (10000 UT) CAPS Take 1 capsule by mouth daily.     cyanocobalamin (VITAMIN B12) 500 MCG tablet Take 500 mcg by mouth daily.     empagliflozin (JARDIANCE) 25  MG TABS tablet Take 12.5 mg by mouth daily.     furosemide  (LASIX ) 40 MG tablet Take 1 tablet (40 mg total) by mouth daily.     metoprolol  tartrate (LOPRESSOR ) 50 MG tablet Take 50 mg by mouth 2 (two) times daily. With breakfast & supper     Multiple Vitamins-Minerals (OCUVITE-LUTEIN PO) Take 1 tablet by mouth 2 (two) times daily.     Multiple Vitamins-Minerals (PRESERVISION AREDS 2 PO) Take 1 tablet by mouth 2 (two) times daily.     Polyethyl Glycol-Propyl Glycol (SYSTANE) 0.4-0.3 % SOLN Place 1 drop into both eyes 2 (two) times daily. SYSTANE     sertraline  (ZOLOFT ) 50 MG tablet Take 25 mg by mouth daily.     No current facility-administered medications for this visit.    Allergies Allergies  Allergen Reactions   Hydrochlorothiazide      Other reaction(s): Headache, Drowsy, Headache, Drowsy   Influenza Vaccines     Other reaction(s): Fever   Lisinopril     Other reaction(s): Cough    Histories Past Medical History:  Diagnosis Date   A-fib (HCC)    Arthritis    Hypertension    Macular degeneration of left eye    Primary localized osteoarthritis of right knee    Past Surgical History:  Procedure Laterality Date   ATRIAL FIBRILLATION ABLATION  03/2016   FOOT ARTHRODESIS, MODIFIED MCBRIDE Right 05/1983   HIP SURGERY Left 04/1999 AND 10/1994   REPLACEMENT X2   INGUINAL HERNIA REPAIR Left 07/2012   DOUBLE HERNIA on the left side   INGUINAL HERNIA REPAIR Right 05/1997   JOINT REPLACEMENT     KNEE CARTILAGE SURGERY Left 04/1998   CARTILAGE   NOSE SURGERY  1959   ROTATOR CUFF REPAIR Right 07/2002   SHOULDER   TOTAL KNEE ARTHROPLASTY Left 10/1999 and  1969   TIMES [phone]   TOTAL KNEE ARTHROPLASTY Right 06/12/2018   Procedure: RIGHT TOTAL KNEE ARTHROPLASTY;  Surgeon: Jerri Kay HERO, MD;  Location: [facility] OR;  Service: Orthopedics;  Laterality: Right;   Social History   Socioeconomic History   Marital status: Divorced    Spouse name: Not on file   Number of children: Not on file    Years of education: Not on file   Highest education level: Not on file  Occupational History   Not on file  Tobacco Use   Smoking status: Former   Smokeless tobacco: Former  Building Services Engineer status: Never Used  Substance and Sexual Activity   Alcohol  use: Not Currently    Comment: ONE GLASS OF WINE PER WEEK    Drug use: No   Sexual activity: Not on file  Other Topics Concern   Not on file  Social History Narrative   Not on file   Social Drivers of Health   Financial Resource Strain: Not on file  Food Insecurity: Not on file  Transportation Needs: Not on file  Physical Activity: Sufficiently Active (06/21/2023)   Received from [facility]   Exercise Vital Sign    On average, how many days per week do you engage in moderate to strenuous exercise (like a brisk walk)?: 3 days    On average, how many minutes do you engage in exercise at this level?: 70 min  Stress: Not on file  Social Connections: Not on file  Intimate Partner Violence: Not on file   Family History  Problem Relation Age of Onset   Prostate cancer Brother    Testicular cancer Brother    Kidney failure Brother    I have reviewed his medical, social, and family history in detail and updated the electronic medical record as necessary.    PHYSICAL EXAMINATION  BP (!) 112/50   Pulse 69  Wt Readings from Last 3 Encounters:  10/21/24 182 lb 15.7 oz (83 kg)  09/19/24 183 lb 9.6 oz (83.3 kg)  05/07/24 200 lb (90.7 kg)   GEN: NAD, appears stated age, doesn't appear chronically ill PSYCH: Cooperative, without pressured speech EYE: Conjunctivae pink, sclerae anicteric ENT: MMM CV: Nontachycardic RESP: No audible wheezing GI: NABS, soft, NT/ND, without rebound or guarding, no HSM appreciated GU: DRE shows MSK/EXT: No significant lower extremity edema SKIN: No jaundice, no spider angiomata NEURO:  Alert & Oriented x 3, no focal deficits, no evidence of asterixis   REVIEW OF DATA  I reviewed the  following data at the time of this encounter:  GI Procedures and Studies  ***  Laboratory Studies  ***  Imaging Studies  ***   ASSESSMENT  Mr. Craig Mann is a 89 y.o. male.  The patient is seen today for evaluation and management of:  No diagnosis found.  ***   PLAN  There are no diagnoses linked to this encounter.   No orders of the defined types were placed in this encounter.   New Prescriptions   No medications on file   Modified Medications   No medications on file    Planned Follow Up No follow-ups on file.   Total Time in Face-to-Face and in Coordination of Care for patient including independent/personal interpretation/review of prior testing, medical history, examination, medication adjustment, communicating results with the patient directly, and documentation within the EHR is ***.   Aloha Finner, MD [facility]oscopy Office # [phone]

## 2024-11-21 ENCOUNTER — Encounter: Payer: Self-pay | Admitting: Gastroenterology

## 2024-11-21 DIAGNOSIS — K769 Liver disease, unspecified: Secondary | ICD-10-CM | POA: Insufficient documentation

## 2024-11-21 DIAGNOSIS — R600 Localized edema: Secondary | ICD-10-CM | POA: Insufficient documentation

## 2024-11-21 DIAGNOSIS — Z7901 Long term (current) use of anticoagulants: Secondary | ICD-10-CM | POA: Insufficient documentation

## 2024-11-21 DIAGNOSIS — I509 Heart failure, unspecified: Secondary | ICD-10-CM | POA: Insufficient documentation

## 2024-11-21 DIAGNOSIS — R7989 Other specified abnormal findings of blood chemistry: Secondary | ICD-10-CM | POA: Insufficient documentation

## 2024-11-21 DIAGNOSIS — T148XXA Other injury of unspecified body region, initial encounter: Secondary | ICD-10-CM | POA: Insufficient documentation

## 2024-11-22 LAB — LAB REPORT - SCANNED
EGFR (Non-African Amer.): 44.1
HM Hepatitis Screen: NEGATIVE

## 2024-11-27 ENCOUNTER — Ambulatory Visit (HOSPITAL_COMMUNITY)

## 2024-12-04 ENCOUNTER — Encounter: Payer: Self-pay | Admitting: Gastroenterology

## 2024-12-04 NOTE — Progress Notes (Signed)
 Review of laboratories performed at PCP office from 11/22/2024  Sodium 138 Potassium 4.4 BUN/creatinine 29/1.5 WBC 6.9 Hemoglobin/hematocrit 12.3/38 Platelets 145 CPK 34 HCV antibody negative GGT 17 (within normal limits) HAV total antibody positive HBV core IgG negative HBV core IgM negative HBV surface antigen negative INR 1.3  These results will be scanned into the chart.  Hepatitis B surface antibody was not completed will need to be done at some point.

## 2024-12-22 ENCOUNTER — Emergency Department (HOSPITAL_COMMUNITY)

## 2024-12-22 ENCOUNTER — Emergency Department (HOSPITAL_COMMUNITY)
Admission: EM | Admit: 2024-12-22 | Discharge: 2024-12-23 | Disposition: A | Discharge status: Skilled Nursing Facility | Attending: Emergency Medicine | Admitting: Emergency Medicine

## 2024-12-22 DIAGNOSIS — I10 Essential (primary) hypertension: Secondary | ICD-10-CM | POA: Diagnosis not present

## 2024-12-22 DIAGNOSIS — Z79899 Other long term (current) drug therapy: Secondary | ICD-10-CM | POA: Diagnosis not present

## 2024-12-22 DIAGNOSIS — H1131 Conjunctival hemorrhage, right eye: Secondary | ICD-10-CM | POA: Diagnosis not present

## 2024-12-22 DIAGNOSIS — S0011XA Contusion of right eyelid and periocular area, initial encounter: Secondary | ICD-10-CM | POA: Diagnosis not present

## 2024-12-22 DIAGNOSIS — I4891 Unspecified atrial fibrillation: Secondary | ICD-10-CM | POA: Insufficient documentation

## 2024-12-22 DIAGNOSIS — W07XXXA Fall from chair, initial encounter: Secondary | ICD-10-CM | POA: Diagnosis not present

## 2024-12-22 DIAGNOSIS — Z7901 Long term (current) use of anticoagulants: Secondary | ICD-10-CM | POA: Insufficient documentation

## 2024-12-22 DIAGNOSIS — S060X0A Concussion without loss of consciousness, initial encounter: Secondary | ICD-10-CM | POA: Diagnosis not present

## 2024-12-22 DIAGNOSIS — S50811A Abrasion of right forearm, initial encounter: Secondary | ICD-10-CM | POA: Insufficient documentation

## 2024-12-22 DIAGNOSIS — R220 Localized swelling, mass and lump, head: Secondary | ICD-10-CM | POA: Diagnosis present

## 2024-12-22 DIAGNOSIS — H1132 Conjunctival hemorrhage, left eye: Secondary | ICD-10-CM

## 2024-12-22 DIAGNOSIS — H05231 Hemorrhage of right orbit: Secondary | ICD-10-CM

## 2024-12-22 NOTE — ED Notes (Signed)
 Patient transported to CT

## 2024-12-22 NOTE — ED Notes (Signed)
Patient transported to CT with TRN.  

## 2024-12-22 NOTE — ED Provider Notes (Signed)
 " [facility] DEPARTMENT AT [facility] Provider Note   CSN: [id number] Arrival date & time: 12/22/24  2316     Patient presents with: Craig Mann is a 89 y.o. male.  {Add pertinent medical, surgical, social history, OB history to YEP:67052} The history is provided by the patient.  Patient with history of A-fib on anticoagulation, hypertension presents after mechanical fall Patient lives in a nursing facility Patient reports he was reaching for an object when he fell landed on his head No LOC but has swelling around his right eye and his left forehead. Also has abrasions to his right forearm.  No chest or abdominal pain.  No neck or back pain.  He denies any other complaints Past Medical History:  Diagnosis Date   A-fib [facility])    Arthritis    Hypertension    Macular degeneration of left eye    Primary localized osteoarthritis of right knee     Prior to Admission medications  Medication Sig Start Date End Date Taking? Authorizing Provider  allopurinol (ZYLOPRIM) 100 MG tablet Take 300 mg by mouth See admin instructions. Take 1 tablet Mon/Wed/Fri 11/14/19   [provider]  apixaban  (ELIQUIS ) 2.5 MG TABS tablet Take 2.5 mg by mouth 2 (two) times daily. 12/22/23   [provider]  Cholecalciferol 250 MCG (10000 UT) CAPS Take 1 capsule by mouth daily.    [provider]  cyanocobalamin (VITAMIN B12) 500 MCG tablet Take 500 mcg by mouth daily.    [provider]  empagliflozin (JARDIANCE) 25 MG TABS tablet Take 12.5 mg by mouth daily. 05/17/24   [provider]  furosemide  (LASIX ) 40 MG tablet Take 1 tablet (40 mg total) by mouth daily. 10/25/24   Arlon Carliss ORN, DO  metoprolol  tartrate (LOPRESSOR ) 50 MG tablet Take 50 mg by mouth 2 (two) times daily. With breakfast & supper    [provider]  Multiple Vitamins-Minerals (OCUVITE-LUTEIN PO) Take 1 tablet by mouth 2 (two) times daily.    [provider]  Multiple Vitamins-Minerals (PRESERVISION AREDS 2 PO) Take 1 tablet by mouth 2 (two) times daily.    [provider]  Polyethyl Glycol-Propyl Glycol (SYSTANE) 0.4-0.3 % SOLN Place 1 drop into both eyes 2 (two) times daily. SYSTANE    [provider]  sertraline  (ZOLOFT ) 50 MG tablet Take 25 mg by mouth daily. 07/31/24   [provider]    Allergies: Hydrochlorothiazide , Influenza vaccines, and Lisinopril    Review of Systems  Skin:  Positive for wound.    Updated Vital Signs There were no vitals taken for this visit.  Physical Exam CONSTITUTIONAL: Elderly, no acute distress HEAD: Abrasion to his left side of his forehead EYES: Significant right periorbital hematoma. No proptosis +EOMI/PERRLA Significant subconjunctival hemorrhage noted to the right eye ENMT: Mucous membranes moist NECK: supple no meningeal signs SPINE/BACK:entire spine nontender No bruising/crepitance/stepoffs noted to spine CV: S1/S2 noted, no murmurs/rubs/gallops noted LUNGS: Lungs are clear to auscultation bilaterally, no apparent distress ABDOMEN: soft, nontender, no rebound or guarding, bowel sounds noted throughout abdomen GU:no cva tenderness NEURO: Pt is awake/alert/appropriate, moves all extremitiesx4.  No facial droop.   EXTREMITIES: pulses normal/equal, full ROM Abrasion to right forearm All other extremities/joints palpated/ranged and nontender SKIN: warm, color normal PSYCH: no abnormalities of mood noted, alert and oriented to situation  (all labs ordered are listed, but only abnormal results are displayed) Labs Reviewed - No data to display  EKG:  None  Radiology: No results found.  {Document cardiac monitor, telemetry assessment procedure when appropriate:32947} Procedures   Medications Ordered in the ED - No data to display    {Click here for ABCD2, HEART and other calculators REFRESH Note before signing:1}                               Medical Decision Making Amount and/or Complexity of Data Reviewed Radiology: ordered.   This patient presents to the ED for concern of head trauma, this involves an extensive number of treatment options, and is a complaint that carries with it a high risk of complications and morbidity.  The differential diagnosis includes but is not limited to subdural hematoma, subarachnoid hemorrhage, skull fracture, concussion   Comorbidities that complicate the patient evaluation: Patients presentation is complicated by their history of atrial fibrillation  Social Determinants of Health: Patients poor mobility, uses a walker  increases the complexity of managing their presentation  Additional history obtained: Additional history obtained from EMS   Imaging Studies ordered: I ordered imaging studies including CT scan head and face  I independently visualized and interpreted imaging which showed *** I agree with the radiologist interpretation  Cardiac Monitoring: The patient was maintained on a cardiac monitor.  I personally viewed and interpreted the cardiac monitor which showed an underlying rhythm of:  {cardiac monitor:26849}  Medicines ordered and prescription drug management: I ordered medication including ***  for ***  Reevaluation of the patient after these medicines showed that the patient    {resolved/improved/worsened:23923::improved}  Test Considered: Patient is low risk / negative by ***, therefore do not feel that *** is indicated.  Critical Interventions:  ***  Consultations Obtained: I requested consultation with the {consultation:26851}, and discussed  findings as well as pertinent plan - they recommend: ***  Reevaluation: After the interventions noted above, I reevaluated the patient and found that they have :{resolved/improved/worsened:23923::improved}  Complexity of problems addressed: Patients presentation is most consistent with   {RNEJ:73156}  Disposition: After consideration of the diagnostic results and the patients response to treatment,  I feel that the patent would benefit from {disposition:26850}.     {Document critical care time when appropriate  Document review of labs and clinical decision tools ie CHADS2VASC2, etc  Document your independent review of radiology images and any outside records  Document your discussion with family members, caretakers and with consultants  Document social determinants of health affecting pt's care  Document your decision making why or why not admission, treatments were needed:32947:::1}   Final diagnoses:  None    ED Discharge Orders     None        "

## 2024-12-22 NOTE — ED Triage Notes (Signed)
 Pt BIB [facility] from [facility] for a mechanical fall (on thinners) from his chair reaching for his cell phone. Pt denies LOC. Pt arrives AAO X4 with significant edema to the R eye and a skin tear to the R forearm. VSS   EMS VITALS BP 118/92  HR 68 O2 96% CBG 174

## 2024-12-23 ENCOUNTER — Other Ambulatory Visit: Payer: Self-pay

## 2024-12-23 NOTE — ED Notes (Signed)
..  Trauma Response Nurse Documentation   Craig Mann is a 89 y.o. male arriving to [facility] ED via [facility]  On Eliquis  (apixaban ) daily. Trauma was activated as a Level 2 by charge nurse based on the following trauma criteria Elderly patients > 65 with head trauma on anti-coagulation (excluding ASA).  Patient cleared for CT by Dr. Fransico. Pt transported to CT with trauma response nurse present to monitor. RN remained with the patient throughout their absence from the department for clinical observation.   GCS 15.  Trauma MD Arrival Time: N/A.  History   Past Medical History:  Diagnosis Date   A-fib (HCC)    Arthritis    Hypertension    Macular degeneration of left eye    Primary localized osteoarthritis of right knee      Past Surgical History:  Procedure Laterality Date   ATRIAL FIBRILLATION ABLATION  03/2016   FOOT ARTHRODESIS, MODIFIED MCBRIDE Right 05/1983   HIP SURGERY Left 04/1999 AND 10/1994   REPLACEMENT X2   INGUINAL HERNIA REPAIR Left 07/2012   DOUBLE HERNIA on the left side   INGUINAL HERNIA REPAIR Right 05/1997   JOINT REPLACEMENT     KNEE CARTILAGE SURGERY Left 04/1998   CARTILAGE   NOSE SURGERY  1959   ROTATOR CUFF REPAIR Right 07/2002   SHOULDER   TOTAL KNEE ARTHROPLASTY Left 10/1999 and 1969   TIMES 04/8035,8037   TOTAL KNEE ARTHROPLASTY Right 06/12/2018   Procedure: RIGHT TOTAL KNEE ARTHROPLASTY;  Surgeon: Jerri Kay HERO, MD;  Location: [facility] OR;  Service: Orthopedics;  Laterality: Right;       Initial Focused Assessment (If applicable, or please see trauma documentation):   CT's Completed:   CT Head and CT Maxillofacial   Interventions:   Plan for disposition:  Discharge home   Consults completed:  none   Event Summary: Pt arrived via [facility] from [facility] after fall. Pt states he was reaching for his cell phone that had fallen, lost his balance falling forward hitting R eye and top of head. +Eliquis  Ccollar in place on arrival, ccpine  cleared by EDP and collar removed.  Significant swell noted to R eye, vision intact, abrasion to tope of head. Pt denies LOC. GCS 15. PT transported to/from CT without incident.  Pt  emergent injuries found, pt d/c back to [facility] via [facility]  MTP Summary (If applicable): N/a  Bedside handoff with ED RN Taylor/Grace.    Craig Mann  Trauma Response RN  Please call TRN at [phone] for further assistance.

## 2025-01-01 ENCOUNTER — Ambulatory Visit (HOSPITAL_COMMUNITY)
